# Patient Record
Sex: Male | Born: 1975 | Race: White | Hispanic: No | Marital: Married | Smoking: Former smoker
Health system: Southern US, Community
[De-identification: ages and names within clinical notes are randomized; demographics above are authoritative.]

## PROBLEM LIST (undated history)

## (undated) DIAGNOSIS — J45909 Unspecified asthma, uncomplicated: Secondary | ICD-10-CM

## (undated) DIAGNOSIS — K859 Acute pancreatitis without necrosis or infection, unspecified: Secondary | ICD-10-CM

## (undated) DIAGNOSIS — F3132 Bipolar disorder, current episode depressed, moderate: Secondary | ICD-10-CM

## (undated) HISTORY — DX: Acute pancreatitis without necrosis or infection, unspecified: K85.90

## (undated) HISTORY — DX: Unspecified asthma, uncomplicated: J45.909

## (undated) HISTORY — PX: HAND SURGERY: SHX662

## (undated) HISTORY — PX: FINGER SURGERY: SHX640

## (undated) HISTORY — DX: Bipolar disorder, current episode depressed, moderate: F31.32

---

## 2013-12-24 ENCOUNTER — Encounter: Payer: Self-pay | Admitting: Family Medicine

## 2013-12-24 ENCOUNTER — Ambulatory Visit (INDEPENDENT_AMBULATORY_CARE_PROVIDER_SITE_OTHER): Payer: BC Managed Care – PPO | Admitting: Family Medicine

## 2013-12-24 ENCOUNTER — Encounter: Payer: Self-pay | Admitting: Physician Assistant

## 2013-12-24 VITALS — BP 139/96 | HR 79 | Temp 98.1°F | Ht 66.5 in | Wt 208.0 lb

## 2013-12-24 DIAGNOSIS — R109 Unspecified abdominal pain: Secondary | ICD-10-CM

## 2013-12-24 DIAGNOSIS — R030 Elevated blood-pressure reading, without diagnosis of hypertension: Secondary | ICD-10-CM

## 2013-12-24 DIAGNOSIS — J111 Influenza due to unidentified influenza virus with other respiratory manifestations: Secondary | ICD-10-CM

## 2013-12-24 DIAGNOSIS — K625 Hemorrhage of anus and rectum: Secondary | ICD-10-CM

## 2013-12-24 DIAGNOSIS — F3132 Bipolar disorder, current episode depressed, moderate: Secondary | ICD-10-CM

## 2013-12-24 DIAGNOSIS — F319 Bipolar disorder, unspecified: Secondary | ICD-10-CM

## 2013-12-24 DIAGNOSIS — IMO0001 Reserved for inherently not codable concepts without codable children: Secondary | ICD-10-CM | POA: Insufficient documentation

## 2013-12-24 HISTORY — DX: Bipolar disorder, current episode depressed, moderate: F31.32

## 2013-12-24 LAB — CBC WITH DIFFERENTIAL/PLATELET
BASOS PCT: 0 % (ref 0–1)
Basophils Absolute: 0 10*3/uL (ref 0.0–0.1)
Eosinophils Absolute: 0.1 10*3/uL (ref 0.0–0.7)
Eosinophils Relative: 1 % (ref 0–5)
HCT: 48.2 % (ref 39.0–52.0)
HEMOGLOBIN: 16.6 g/dL (ref 13.0–17.0)
LYMPHS ABS: 3.2 10*3/uL (ref 0.7–4.0)
Lymphocytes Relative: 35 % (ref 12–46)
MCH: 29 pg (ref 26.0–34.0)
MCHC: 34.4 g/dL (ref 30.0–36.0)
MCV: 84.1 fL (ref 78.0–100.0)
MONOS PCT: 6 % (ref 3–12)
Monocytes Absolute: 0.5 10*3/uL (ref 0.1–1.0)
NEUTROS PCT: 58 % (ref 43–77)
Neutro Abs: 5.3 10*3/uL (ref 1.7–7.7)
Platelets: 258 10*3/uL (ref 150–400)
RBC: 5.73 MIL/uL (ref 4.22–5.81)
RDW: 13.5 % (ref 11.5–15.5)
WBC: 9.1 10*3/uL (ref 4.0–10.5)

## 2013-12-24 MED ORDER — OSELTAMIVIR PHOSPHATE 75 MG PO CAPS: 75.0000 mg | ORAL_CAPSULE | Freq: Two times a day (BID) | ORAL | Status: DC

## 2013-12-24 MED ORDER — HYOSCYAMINE SULFATE 0.125 MG PO TABS: 0.1250 mg | ORAL_TABLET | ORAL | Status: DC | PRN

## 2013-12-24 MED ORDER — LURASIDONE HCL 20 MG PO TABS: 20.0000 mg | ORAL_TABLET | Freq: Every day | ORAL | Status: DC

## 2013-12-24 NOTE — Patient Instructions (Signed)
Start Latuda samples 20mg , one tablet daily, Rx has also been sent to your pharmacy.

## 2013-12-24 NOTE — Progress Notes (Addendum)
CC: Frederick Lloyd is a 38 y.o. male is here for Establish Care   Subjective: HPI:  Complains of concerns regarding body aches, nasal congestion, fatigue that has been present for the past 24 hours. his son was recently diagnosed with influenza. Patient reports frequent direct contact with his son. Symptoms have been worsening slowly since acute onset. No interventions as of yet nothing makes better or worse. Symptoms overall are moderate to severe in severity.  Patient complains of rectal bleeding off and on for the past 2-3 years. He describes it as bright red blood and also tarry appearance to his stools. It is accompanied by abdominal pain which is slightly relieved with taking Levsin. Workup has included a noncontrast CT at [facility] which was unremarkable. He has been unable to see a GI physician due to lack of insurance which he recently acquired. Abdominal pain is described only as pain and can occur anywhere within the abdomen. Pain is not improved or relieved by defecation or diet. Denies unintentional weight loss.  Patient reports a history of bipolar disorder with predominance of depression and very minimal manic activity. This was diagnosed in his late teenage years and has only been well managed when he was taking JordanLatuda which she has been unable to afford for years. He's also tried Depakote but this caused intolerable sedation. He reports he is currently at least moderately depressed and often feels that everything in life is futile, denies thoughts when himself or others. Symptoms are worsened by physical health issues described above. Nothing else particularly makes better or worse.  Review of Systems - General ROS: negative for - chills, fever, night sweats, weight gain or weight loss Ophthalmic ROS: negative for - decreased vision Psychological ROS: negative for - anxiety ENT ROS: negative for - hearing change, tinnitus or allergies Hematological and Lymphatic ROS: negative  for -  bruising or swollen lymph nodes Breast ROS: negative Respiratory ROS: no , shortness of breath, or wheezing Cardiovascular ROS: no chest pain or dyspnea on exertion Gastrointestinal: No vomiting Genito-Urinary ROS: negative for - genital discharge, genital ulcers, incontinence or abnormal bleeding from genitals Musculoskeletal ROS: negative for - joint pain or muscle pain other than that described above Neurological ROS: negative for - headaches or memory loss Dermatological ROS: negative for lumps, mole changes, rash and skin lesion changes Review Of Systems Outlined In HPI  Past Medical History  Diagnosis Date  . Bipolar 1 disorder, depressed, moderate 12/24/2013    Intolerant of depakote      History reviewed. No pertinent past surgical history. History reviewed. No pertinent family history.  History   Social History  . Marital Status: N/A    Spouse Name: N/A    Number of Children: N/A  . Years of Education: N/A   Occupational History  . Not on file.   Social History Main Topics  . Smoking status: Former Games developermoker  . Smokeless tobacco: Not on file     Comment: former tobacco smoker; uses vapor ciggs  . Alcohol Use: Not on file  . Drug Use: Not on file  . Sexual Activity: Not on file   Other Topics Concern  . Not on file   Social History Narrative  . No narrative on file     Objective: BP 139/96  Pulse 79  Temp(Src) 98.1 F (36.7 C) (Oral)  Ht 5' 6.5" (1.689 m)  Wt 208 lb (94.348 kg)  BMI 33.07 kg/m2  General: Alert and Oriented, No Acute Distress HEENT:  Pupils equal, round, reactive to light. Conjunctivae clear.  External ears unremarkable, canals clear with intact TMs with appropriate landmarks.  Middle ear appears open without effusion. Pink inferior turbinates with moderate mucoid discharge.  Moist mucous membranes, pharynx without inflammation nor lesions.  Neck supple without palpable lymphadenopathy nor abnormal masses. Lungs: Clear to auscultation  bilaterally, no wheezing/ronchi/rales.  Comfortable work of breathing. Good air movement. Cardiac: Regular rate and rhythm. Normal S1/S2.  No murmurs, rubs, nor gallops.   Abdomen: Normal bowel sounds, soft without rebound or guarding. There is mild left lower quadrant tenderness without any palpable masses nor pain in any other quadrant. Extremities: No peripheral edema.  Strong peripheral pulses.  Mental Status: Moderate depression, tearful. No anxiety, nor agitation. Skin: Warm and dry.  Assessment & Plan: Elijha was seen today for establish care.  Diagnoses and associated orders for this visit:  Bipolar disorder, unspecified - Lurasidone HCl (LATUDA) 20 MG TABS; Take 1 tablet (20 mg total) by mouth daily.  Elevated blood pressure  Abdominal pain, unspecified site - Ambulatory referral to Gastroenterology - hyoscyamine (LEVSIN) 0.125 MG tablet; Take 1 tablet (0.125 mg total) by mouth every 4 (four) hours as needed. - COMPLETE METABOLIC PANEL WITH GFR  Rectal bleeding - Ambulatory referral to Gastroenterology - CBC w/Diff  Influenza - oseltamivir (TAMIFLU) 75 MG capsule; Take 1 capsule (75 mg total) by mouth 2 (two) times daily.  Bipolar 1 disorder, depressed, moderate    Bipolar disorder: Uncontrolled restart Lurasidone, 3 weeks samples were provided along with a prescription Influenza: Given the beginning of his symptoms and a known influenza contact at home start Tamiflu Rectal bleeding: Will arrange gastroenterology referral for consideration of colonoscopy, checking lipids and hemoglobin today Abdominal pain: Referral to GI with white count and differential today along with complete metabolic panel  Return in about 4 weeks (around 01/21/2014) for Bipolar Followup.

## 2013-12-25 LAB — COMPLETE METABOLIC PANEL WITH GFR
ALBUMIN: 4.4 g/dL (ref 3.5–5.2)
ALK PHOS: 55 U/L (ref 39–117)
ALT: 27 U/L (ref 0–53)
AST: 23 U/L (ref 0–37)
BILIRUBIN TOTAL: 0.4 mg/dL (ref 0.2–1.2)
BUN: 9 mg/dL (ref 6–23)
CO2: 26 meq/L (ref 19–32)
Calcium: 9.3 mg/dL (ref 8.4–10.5)
Chloride: 104 mEq/L (ref 96–112)
Creat: 0.94 mg/dL (ref 0.50–1.35)
GFR, Est African American: >89 mL/min
GFR, Est Non African American: >89 mL/min
Glucose, Bld: 102 mg/dL — ABNORMAL HIGH (ref 70–99)
Potassium: 4.4 mEq/L (ref 3.5–5.3)
SODIUM: 139 meq/L (ref 135–145)
TOTAL PROTEIN: 7.1 g/dL (ref 6.0–8.3)

## 2013-12-31 ENCOUNTER — Telehealth: Payer: Self-pay | Admitting: Family Medicine

## 2013-12-31 DIAGNOSIS — F3132 Bipolar disorder, current episode depressed, moderate: Secondary | ICD-10-CM

## 2013-12-31 MED ORDER — ARIPIPRAZOLE 10 MG PO TABS: 10.0000 mg | ORAL_TABLET | Freq: Every day | ORAL | Status: DC

## 2013-12-31 NOTE — Telephone Encounter (Signed)
Sue Lushndrea, Will you please let patient know that [employer] has denied coverage for Latuda.  He can either pay for this out of pocket or try another option on his formulary such as Seroquel Immediate Release, Abilify, or Zyprexa.  I'd recommend Abilify given my success with other patient's in the past.  Please let me know if he has a preference.

## 2013-12-31 NOTE — Telephone Encounter (Signed)
[facility] [place]. Stop taking latuda once abilify is available.

## 2013-12-31 NOTE — Telephone Encounter (Signed)
Pt notified and he is willing to try abilify

## 2014-01-01 ENCOUNTER — Ambulatory Visit (INDEPENDENT_AMBULATORY_CARE_PROVIDER_SITE_OTHER): Payer: BC Managed Care – PPO | Admitting: Physician Assistant

## 2014-01-01 ENCOUNTER — Encounter: Payer: Self-pay | Admitting: Physician Assistant

## 2014-01-01 VITALS — BP 106/78 | HR 68 | Ht 66.5 in | Wt 210.4 lb

## 2014-01-01 DIAGNOSIS — G8929 Other chronic pain: Secondary | ICD-10-CM

## 2014-01-01 DIAGNOSIS — R197 Diarrhea, unspecified: Secondary | ICD-10-CM

## 2014-01-01 DIAGNOSIS — K625 Hemorrhage of anus and rectum: Secondary | ICD-10-CM

## 2014-01-01 DIAGNOSIS — R1031 Right lower quadrant pain: Secondary | ICD-10-CM

## 2014-01-01 DIAGNOSIS — R1032 Left lower quadrant pain: Secondary | ICD-10-CM

## 2014-01-01 DIAGNOSIS — R109 Unspecified abdominal pain: Secondary | ICD-10-CM

## 2014-01-01 MED ORDER — TRAMADOL HCL 50 MG PO TABS: 50.0000 mg | ORAL_TABLET | Freq: Four times a day (QID) | ORAL | Status: DC | PRN

## 2014-01-01 MED ORDER — HYOSCYAMINE SULFATE 0.125 MG PO TABS: 0.1250 mg | ORAL_TABLET | ORAL | Status: DC | PRN

## 2014-01-01 NOTE — Progress Notes (Signed)
Reviewed and agree with management plan. Please attempt to obtain Abd CT and [facility]. Short term Ultram with no plans for additional pain medications.   Venita LickMalcolm T. Russella DarStark, MD [facility]

## 2014-01-01 NOTE — Telephone Encounter (Signed)
Pt aware.

## 2014-01-01 NOTE — Progress Notes (Signed)
Subjective:    Patient ID: Frederick Lloyd, male    DOB: September 17, 1976, 38 y.o.   MRN: [id number]  HPI  Frederick Lloyd is a 38 year old white male new to GI today self-referred. He is generally in good health does have history of bipolar disorder and is followed by Dr. Ivan Anchors. Patient says that he has been having problems over the past 2 years and progressive over the past one year. He says he said prior ER visits for abdominal pain diarrhea and rectal bleeding both he and he agreed and at [facility]. He says he had a CT scan done at [facility] it did show "inflammation" but he did not have insurance at that time and was unable to see a gastroenterologist. He says he has recently obtained insurance and comes in for valuation. He says over the past year he has been having diarrhea on a daily basis usually 5-8 bowel movements per day of loose liquid stool. Occasionally he has some form to his stool. He has a lot of gas and flatulence,as well as frequent bright red blood mixed with his bowel movements. He says occasionally he passes something that looks "like a red Jell-O substance". He has ongoing issues with abdominal cramping bloating and sometimes may go a day without a bowel movement and then have a bad day with cramping and several bowel movements. He says sometimes the pain is so bad he develops nausea but usually doesn't vomit. His appetite has been fair his weight has been stable was not had any documented fevers. He has been given a prescription for Levsin which she says does help cramping. Family history is negative for IBD is far as he is aware he does have a great grandmother who had colon cancer.    Review of Systems  Constitutional: Positive for fatigue.  HENT: Negative.   Eyes: Negative.   Respiratory: Negative.   Cardiovascular: Negative.   Gastrointestinal: Positive for abdominal pain, diarrhea and blood in stool.  Endocrine: Negative.   Genitourinary: Negative.   Musculoskeletal:  Negative.   Allergic/Immunologic: Negative.   Neurological: Negative.   Hematological: Negative.   Psychiatric/Behavioral: Negative.    Outpatient Prescriptions Prior to Visit  Medication Sig Dispense Refill  . ARIPiprazole (ABILIFY) 10 MG tablet Take 1 tablet (10 mg total) by mouth daily.  30 tablet  1  . hyoscyamine (LEVSIN) 0.125 MG tablet Take 1 tablet (0.125 mg total) by mouth every 4 (four) hours as needed.  60 tablet  1  . oseltamivir (TAMIFLU) 75 MG capsule Take 1 capsule (75 mg total) by mouth 2 (two) times daily.  10 capsule  0   No facility-administered medications prior to visit.      No Known Allergies Patient Active Problem List   Diagnosis Date Noted  . Bipolar 1 disorder, depressed, moderate 12/24/2013  . Elevated blood pressure 12/24/2013  . Abdominal pain, unspecified site 12/24/2013  . Rectal bleeding 12/24/2013   History  Substance Use Topics  . Smoking status: Former Smoker    Types: Cigarettes    Quit date: 09/26/2012  . Smokeless tobacco: Never Used     Comment: former tobacco smoker; uses vapor ciggs  . Alcohol Use: Yes     Comment: occasional   family history includes Colon cancer in his maternal uncle and paternal grandmother; Diabetes in his other; Heart disease in his paternal grandfather; Pancreatic cancer in his father.  Objective:   Physical Exam   Well-developed white male in no acute distress, blood pressure  106/78 pulse 68 height 5 foot 6 weight 210. HEENT; nontraumatic normocephalic EOMI PERRLA sclera anicteric, Supple; no JVD, Cardiovascular ;regular rate and rhythm with S1-S2 no murmur or gallop, Pulmonary; clear bilaterally, Abdomen; soft and is tender in both lower quadrants right greater than left no guarding or rebound no palpable mass or hepatosplenomegaly, Rectal; exam not done, Extremities ;no clubbing cyanosis or edema skin warm and dry multiple tattoos, Psych; mood and affect appropriate       Assessment & Plan:  #1  38 yo old  male with 1-1/2-2 year history of lower abdominal pain, diarrhea and frequent rectal  bleeding. Will need to rule out IBD versus IBS with local anorectal source for bleeding #2 bipolar disorder  Plan; patient had recent labs done with CBC and cemented both unremarkable; Will schedule for colonoscopy with Dr. Jalene MulletStark-procedure discussed in detail and he is agreeable to proceed Continue Levsin sublingual every 4-6 hours as needed for cramping Patient asked for pain medication and is given a short term prescription for Ultram 50 mg every 6-8 hours as needed for pain #30 no refills- I also advised him that we would not continue to prescribe pain meds  Further plans pending results of colonoscopy.

## 2014-01-01 NOTE — Patient Instructions (Signed)
We sent refills for Levsin antispasmodic [facility] [place].  We also faxed them a prescription for Ultram ( Tramadol ) for pain.  You have been scheduled for a colonoscopy with propofol. Please follow written instructions given to you at your visit today.  We have given you a sample prep. You will not need to get one from the pharmacy. If you use inhalers (even only as needed), please bring them with you on the day of your procedure. Your physician has requested that you go to www.startemmi.com and enter the access code given to you at your visit today. This web site gives a general overview about your procedure. However, you should still follow specific instructions given to you by our office regarding your preparation for the procedure.

## 2014-01-02 ENCOUNTER — Ambulatory Visit (AMBULATORY_SURGERY_CENTER): Payer: BC Managed Care – PPO | Admitting: Gastroenterology

## 2014-01-02 ENCOUNTER — Encounter: Payer: Self-pay | Admitting: Gastroenterology

## 2014-01-02 VITALS — BP 116/69 | HR 65 | Temp 96.6°F | Resp 15 | Ht 66.5 in | Wt 210.0 lb

## 2014-01-02 DIAGNOSIS — K921 Melena: Secondary | ICD-10-CM

## 2014-01-02 DIAGNOSIS — R197 Diarrhea, unspecified: Secondary | ICD-10-CM

## 2014-01-02 DIAGNOSIS — K625 Hemorrhage of anus and rectum: Secondary | ICD-10-CM

## 2014-01-02 DIAGNOSIS — R109 Unspecified abdominal pain: Secondary | ICD-10-CM

## 2014-01-02 MED ORDER — GLYCOPYRROLATE 2 MG PO TABS: 2.0000 mg | ORAL_TABLET | Freq: Two times a day (BID) | ORAL | Status: DC

## 2014-01-02 MED ORDER — SODIUM CHLORIDE 0.9 % IV SOLN: 500.0000 mL | INTRAVENOUS | Status: DC

## 2014-01-02 MED ORDER — HYOSCYAMINE SULFATE 0.125 MG PO TABS: 0.1250 mg | ORAL_TABLET | ORAL | Status: DC | PRN

## 2014-01-02 NOTE — Op Note (Signed)
[facility] [address] [place], [id number]   COLONOSCOPY PROCEDURE REPORT  PATIENT: Frederick Lloyd, Frederick Lloyd  MR#: [id number] BIRTHDATE: [birth year] , 37  yrs. old GENDER: Male ENDOSCOPIST: Meryl DareMalcolm T Laron Angelini, MD, [facility] REFERRED GN:FAOZBY:Sean Hommel, D.O. PROCEDURE DATE:  01/02/2014 PROCEDURE:   Colonoscopy with biopsy First Screening Colonoscopy - Avg.  risk and is 50 yrs.  old or older - No.  Prior Negative Screening - Now for repeat screening. N/A  History of Adenoma - Now for follow-up colonoscopy & has been > or = to 3 yrs.  N/A  Polyps Removed Today? No.  Recommend repeat exam, <10 yrs? No. ASA CLASS:   Class Lloyd INDICATIONS:Unexplained diarrhea, abdominal pain, and hematochezia.  MEDICATIONS: MAC sedation, administered by CRNA and propofol (Diprivan) 400mg  IV DESCRIPTION OF PROCEDURE:   After the risks benefits and alternatives of the procedure were thoroughly explained, informed consent was obtained.  A digital rectal exam revealed no abnormalities of the rectum.   The LB [id number]  endoscope was introduced through the anus and advanced to the terminal ileum which was intubated for a short distance. No adverse events experienced.   The quality of the prep was good, using MoviPrep The instrument was then slowly withdrawn as the colon was fully examined.  COLON FINDINGS: A normal appearing cecum, ileocecal valve, and appendiceal orifice were identified.  The ascending, hepatic flexure, transverse, splenic flexure, descending, sigmoid colon and rectum appeared unremarkable.  No polyps or cancers were seen. Multiple random biopsies of the left colon were performed.   The mucosa appeared normal in the terminal ileum.  Retroflexed views revealed small internal hemorrhoids. The time to cecum=2 minutes 30 seconds.  Withdrawal time=10 minutes 08 seconds.  The scope was withdrawn and the procedure completed. COMPLICATIONS: There were no complications.  ENDOSCOPIC  IMPRESSION: 1.   Normal colon; multiple random biopsies performed 2.   Normal mucosa in the terminal ileum 3.   Internal hemorrhoids  RECOMMENDATIONS: 1.  Await pathology results 2.  Prep H supp daily as needed and standard rectal care instructions for hemorrhoids 3.  Glycopyrrolate 2 mg po bid, 1 year of refills, for IBS 4.  Continue Levsin 1-2 PO/SL qh4 prn abd pain, for IBS 5.  FODMAP diet for IBS 6.  Office appt in 6 weeks  eSigned:  Meryl DareMalcolm T Alaysha Jefcoat, MD, [facility] 01/02/2014 10:56 AM     PATIENT NAME:  Frederick Lloyd, Frederick Lloyd MR#: [id number]

## 2014-01-02 NOTE — Patient Instructions (Signed)
YOU HAD AN ENDOSCOPIC PROCEDURE TODAY AT THE [facility]: Refer to the procedure report that was given to you for any specific questions about what was found during the examination.  If the procedure report does not answer your questions, please call your gastroenterologist to clarify.  If you requested that your care partner not be given the details of your procedure findings, then the procedure report has been included in a sealed envelope for you to review at your convenience later.  YOU SHOULD EXPECT: Some feelings of bloating in the abdomen. Passage of more gas than usual.  Walking can help get rid of the air that was put into your GI tract during the procedure and reduce the bloating. If you had a lower endoscopy (such as a colonoscopy or flexible sigmoidoscopy) you may notice spotting of blood in your stool or on the toilet paper. If you underwent a bowel prep for your procedure, then you may not have a normal bowel movement for a few days.  DIET: Your first meal following the procedure should be a light meal and then it is ok to progress to your normal diet.  A half-sandwich or bowl of soup is an example of a good first meal.  Heavy or fried foods are harder to digest and may make you feel nauseous or bloated.  Likewise meals heavy in dairy and vegetables can cause extra gas to form and this can also increase the bloating.  Drink plenty of fluids but you should avoid alcoholic beverages for 24 hours.  ACTIVITY: Your care partner should take you home directly after the procedure.  You should plan to take it easy, moving slowly for the rest of the day.  You can resume normal activity the day after the procedure however you should NOT DRIVE or use heavy machinery for 24 hours (because of the sedation medicines used during the test).    SYMPTOMS TO REPORT IMMEDIATELY: A gastroenterologist can be reached at any hour.  During normal business hours, 8:30 AM to 5:00 PM Monday through Friday,  call [phone].  After hours and on weekends, please call the GI answering service at [phone] who will take a message and have the physician on call contact you.   Following lower endoscopy (colonoscopy or flexible sigmoidoscopy):  Excessive amounts of blood in the stool  Significant tenderness or worsening of abdominal pains  Swelling of the abdomen that is new, acute  Fever of 100F or higher    FOLLOW UP: If any biopsies were taken you will be contacted by phone or by letter within the next 1-3 weeks.  Call your gastroenterologist if you have not heard about the biopsies in 3 weeks.  Our staff will call the home number listed on your records the next business day following your procedure to check on you and address any questions or concerns that you may have at that time regarding the information given to you following your procedure. This is a courtesy call and so if there is no answer at the home number and we have not heard from you through the emergency physician on call, we will assume that you have returned to your regular daily activities without incident.  SIGNATURES/CONFIDENTIALITY: You and/or your care partner have signed paperwork which will be entered into your electronic medical record.  These signatures attest to the fact that that the information above on your After Visit Summary has been reviewed and is understood.  Full responsibility of the confidentiality   of this discharge information lies with you and/or your care-partner.     

## 2014-01-02 NOTE — Progress Notes (Signed)
Called to room to assist during endoscopic procedure.  Patient ID and intended procedure confirmed with present staff. Received instructions for my participation in the procedure from the performing physician.  

## 2014-01-02 NOTE — Progress Notes (Signed)
Lidocaine-40mg IV prior to Propofol InductionPropofol given over incremental dosages 

## 2014-01-03 ENCOUNTER — Telehealth: Payer: Self-pay

## 2014-01-03 NOTE — Telephone Encounter (Signed)
  Follow up Call-  Call back number 01/02/2014  Post procedure Call Back phone  # [phone]  Permission to leave phone message Yes     Patient questions:  Do you have a fever, pain , or abdominal swelling? no Pain Score  0 *  Have you tolerated food without any problems? yes  Have you been able to return to your normal activities? yes  Do you have any questions about your discharge instructions: Diet   no Medications  no Follow up visit  no  Do you have questions or concerns about your Care? no  Actions: * If pain score is 4 or above: No action needed, pain <4.

## 2014-01-07 ENCOUNTER — Encounter: Payer: Self-pay | Admitting: Gastroenterology

## 2014-01-21 ENCOUNTER — Ambulatory Visit (INDEPENDENT_AMBULATORY_CARE_PROVIDER_SITE_OTHER): Payer: BC Managed Care – PPO | Admitting: Family Medicine

## 2014-01-21 ENCOUNTER — Encounter: Payer: Self-pay | Admitting: Family Medicine

## 2014-01-21 VITALS — BP 141/93 | HR 89 | Wt 209.0 lb

## 2014-01-21 DIAGNOSIS — M79642 Pain in left hand: Secondary | ICD-10-CM

## 2014-01-21 DIAGNOSIS — F3132 Bipolar disorder, current episode depressed, moderate: Secondary | ICD-10-CM

## 2014-01-21 DIAGNOSIS — G5600 Carpal tunnel syndrome, unspecified upper limb: Secondary | ICD-10-CM

## 2014-01-21 DIAGNOSIS — G5601 Carpal tunnel syndrome, right upper limb: Secondary | ICD-10-CM

## 2014-01-21 DIAGNOSIS — M79609 Pain in unspecified limb: Secondary | ICD-10-CM

## 2014-01-21 MED ORDER — GABAPENTIN 300 MG PO CAPS: 300.0000 mg | ORAL_CAPSULE | Freq: Three times a day (TID) | ORAL | Status: DC

## 2014-01-21 MED ORDER — CELECOXIB 200 MG PO CAPS: 200.0000 mg | ORAL_CAPSULE | Freq: Two times a day (BID) | ORAL | Status: DC | PRN

## 2014-01-21 NOTE — Progress Notes (Signed)
CC: Frederick Lloyd is a 38 y.o. male is here for f/u bipolar   Subjective: HPI:  Follow bipolar disorder: Continues to take Latuda 40mg  on a daily basis without missed doses. He states that he feels much more stable from an emotional standpoint. Still has occasional anxiety but denies any manic-like activity or depression. He has not switched over to Abilify yet due to a $75 co-pay, he is still using samples and left over medication from about a year ago. Denies any mental disturbance other than anxiety which is only worsened at work.  Complains of left hand pain that has been present for the past one week. Is accompanied with swelling and redness at the second and third MCP joint dorsal surface. Described only as aching and moderate in severity. Worse with repetitive movements. Pain is nonradiating. Slightly improved with ibuprofen no other interventions as of yet.  He describes a long history of trauma and orthopedic surgery done to the right hand however no chronic issues with the left hand.  Denies motor or sensory disturbances in the left hand. No improvement with tramadol.  Complains of right hand numbness that has been present for matter of decades. Has been present ever since dislocation of the wrist. He tells me he's had nerve conduction studies done years ago which were normal in the right upper extremity. He reports decreased fine motor skills, and mild numbness in the thumb index and middle finger located on the palm that has been present for matter of years and has not been getting better or worsens onset, currently moderate in severity. Worse with repetitive movements. He has had a benefit from gabapentin for this in the past  Review of systems positive for alternating constipation and diarrhea since his colonoscopy.  Review Of Systems Outlined In HPI  Past Medical History  Diagnosis Date  . Bipolar 1 disorder, depressed, moderate 12/24/2013    Intolerant of depakote    .  Pancreatitis   . Asthmatic bronchitis     Past Surgical History  Procedure Laterality Date  . Hand surgery Right     finger surgery andskin graft  . Finger surgery Right    Family History  Problem Relation Age of Onset  . Colon cancer Maternal Uncle   . Colon cancer Paternal Grandmother     great  . Pancreatic cancer Father     ?  . Diabetes Other     both sides of the family  . Heart disease Paternal Grandfather     History   Social History  . Marital Status: Married    Spouse Name: N/A    Number of Children: 4  . Years of Education: N/A   Occupational History  . Air traffic controllerautomotive tech    Social History Main Topics  . Smoking status: Former Smoker    Types: Cigarettes    Quit date: 09/26/2012  . Smokeless tobacco: Never Used     Comment: former tobacco smoker; uses vapor ciggs  . Alcohol Use: Yes     Comment: occasional  . Drug Use: No  . Sexual Activity: Not on file   Other Topics Concern  . Not on file   Social History Narrative  . No narrative on file     Objective: BP 141/93  Pulse 89  Wt 209 lb (94.802 kg)  General: Alert and Oriented, No Acute Distress HEENT: Pupils equal, round, reactive to light. Conjunctivae clear. Moist mucous membranes pharynx unremarkable Lungs: Clear to auscultation bilaterally, no wheezing/ronchi/rales.  Comfortable work of breathing. Good air movement. Cardiac: Regular rate and rhythm. Normal S1/S2.  No murmurs, rubs, nor gallops.  Mild redness and swelling over the dorsal surface of second and third MCP joints in the left hand, pain is reproduced with palpation of these joints are resisted finger flexion. Reverse Phalen's is negative on the left however positive on the right. Full range of motion and strength in both hands. Extremities: No peripheral edema.  Strong peripheral pulses.  Mental Status: No depression, anxiety, nor agitation. Skin: Warm and dry.  Assessment & Plan: Frederick Lloyd was seen today for f/u bipolar.  Diagnoses  and associated orders for this visit:  Bipolar 1 disorder, depressed, moderate  Elevated blood pressure  Left hand pain - celecoxib (CELEBREX) 200 MG capsule; Take 1 capsule (200 mg total) by mouth 2 (two) times daily as needed (hand pain).  Right carpal tunnel syndrome - gabapentin (NEURONTIN) 300 MG capsule; Take 1 capsule (300 mg total) by mouth 3 (three) times daily.    Bipolar disorder: Improved and stable, I've given him 3 more weeks worth of Latuda until he can afford switching to Abilify. Followup with me approximately 2 weeks after the switch Left hand pain: Suspect osteoarthritis start Celebrex, encouraged him to stop taking Ultram for abdominal pain as this could be contributing to constipation Right carpal tunnel syndrome: Restart former regimen of gabapentin along with stretching Elevated blood pressure: Discussed sodium restriction, trial of lifestyle changes before starting medication  40 minutes spent face-to-face during visit today of which at least 50% was counseling or coordinating care regarding: 1. Bipolar 1 disorder, depressed, moderate   2. Left hand pain   3. Right carpal tunnel syndrome        Return in about 4 weeks (around 02/18/2014).

## 2014-02-05 ENCOUNTER — Ambulatory Visit (INDEPENDENT_AMBULATORY_CARE_PROVIDER_SITE_OTHER): Payer: BC Managed Care – PPO | Admitting: Gastroenterology

## 2014-02-05 ENCOUNTER — Encounter: Payer: Self-pay | Admitting: Gastroenterology

## 2014-02-05 VITALS — BP 120/88 | HR 64 | Ht 66.5 in | Wt 211.8 lb

## 2014-02-05 DIAGNOSIS — K5900 Constipation, unspecified: Secondary | ICD-10-CM

## 2014-02-05 DIAGNOSIS — K589 Irritable bowel syndrome without diarrhea: Secondary | ICD-10-CM

## 2014-02-05 NOTE — Progress Notes (Signed)
    History of Present Illness: This is a 38 year old male with irritable bowel syndrome and internal hemorrhoids. His recent colonoscopy was unremarkable except for internal hemorrhoids. His symptoms are substantially improved on glycopyrrolate and hyoscyamine. He now has mild constipation that improves if he misses a dose of glycopyrrolate. He occasionally notes rectal bleeding but this is also improved.  Current Medications, Allergies, Past Medical History, Past Surgical History, Family History and Social History were reviewed in [facility] electronic medical record.  Physical Exam: General: Well developed , well nourished, no acute distress Head: Normocephalic and atraumatic Eyes:  sclerae anicteric, EOMI Ears: Normal auditory acuity Mouth: No deformity or lesions Lungs: Clear throughout to auscultation Heart: Regular rate and rhythm; no murmurs, rubs or bruits Abdomen: Soft, non tender and non distended. No masses, hepatosplenomegaly or hernias noted. Normal Bowel sounds Musculoskeletal: Symmetrical with no gross deformities  Pulses:  Normal pulses noted Extremities: No clubbing, cyanosis, edema or deformities noted Neurological: Alert oriented x 4, grossly nonfocal Psychological:  Alert and cooperative. Normal mood and affect  Assessment and Recommendations:  1. IBS with constipation. Continue glycopyrrolate and hyoscyamine. Increase dietary water and fiber intake. Begin Colace once or twice daily and his constipation is not adequately controlled with fiber and water. Return office visit in one year.  2. Internal hemorrhoids leading to hematochezia. High fiber diet increased water intake as above. Preparation H suppositories daily as needed.

## 2014-02-05 NOTE — Patient Instructions (Signed)
Take over the counter Benefiber once daily for constipation. If this does not adequately control your symptoms then you can add Colace 2 tablets a day.   High-Fiber Diet Fiber is found in fruits, vegetables, and grains. A high-fiber diet encourages the addition of more whole grains, legumes, fruits, and vegetables in your diet. The recommended amount of fiber for adult males is 38 g per day. For adult females, it is 25 g per day. Pregnant and lactating women should get 28 g of fiber per day. If you have a digestive or bowel problem, ask your caregiver for advice before adding high-fiber foods to your diet. Eat a variety of high-fiber foods instead of only a select few type of foods.  PURPOSE  To increase stool bulk.  To make bowel movements more regular to prevent constipation.  To lower cholesterol.  To prevent overeating. WHEN IS THIS DIET USED?  It may be used if you have constipation and hemorrhoids.  It may be used if you have uncomplicated diverticulosis (intestine condition) and irritable bowel syndrome.  It may be used if you need help with weight management.  It may be used if you want to add it to your diet as a protective measure against atherosclerosis, diabetes, and cancer. SOURCES OF FIBER  Whole-grain breads and cereals.  Fruits, such as apples, oranges, bananas, berries, prunes, and pears.  Vegetables, such as green peas, carrots, sweet potatoes, beets, broccoli, cabbage, spinach, and artichokes.  Legumes, such split peas, soy, lentils.  Almonds. FIBER CONTENT IN FOODS Starches and Grains / Dietary Fiber (g)  Cheerios, 1 cup / 3 g  Corn Flakes cereal, 1 cup / 0.7 g  Rice crispy treat cereal, 1 cup / 0.3 g  Instant oatmeal (cooked),  cup / 2 g  Frosted wheat cereal, 1 cup / 5.1 g  Brown, long-grain rice (cooked), 1 cup / 3.5 g  White, long-grain rice (cooked), 1 cup / 0.6 g  Enriched macaroni (cooked), 1 cup / 2.5 g Legumes / Dietary Fiber  (g)  Baked beans (canned, plain, or vegetarian),  cup / 5.2 g  Kidney beans (canned),  cup / 6.8 g  Pinto beans (cooked),  cup / 5.5 g Breads and Crackers / Dietary Fiber (g)  Plain or honey graham crackers, 2 squares / 0.7 g  Saltine crackers, 3 squares / 0.3 g  Plain, salted pretzels, 10 pieces / 1.8 g  Whole-wheat bread, 1 slice / 1.9 g  White bread, 1 slice / 0.7 g  Raisin bread, 1 slice / 1.2 g  Plain bagel, 3 oz / 2 g  Flour tortilla, 1 oz / 0.9 g  Corn tortilla, 1 small / 1.5 g  Hamburger or hotdog bun, 1 small / 0.9 g Fruits / Dietary Fiber (g)  Apple with skin, 1 medium / 4.4 g  Sweetened applesauce,  cup / 1.5 g  Banana,  medium / 1.5 g  Grapes, 10 grapes / 0.4 g  Orange, 1 small / 2.3 g  Raisin, 1.5 oz / 1.6 g  Melon, 1 cup / 1.4 g Vegetables / Dietary Fiber (g)  Green beans (canned),  cup / 1.3 g  Carrots (cooked),  cup / 2.3 g  Broccoli (cooked),  cup / 2.8 g  Peas (cooked),  cup / 4.4 g  Mashed potatoes,  cup / 1.6 g  Lettuce, 1 cup / 0.5 g  Corn (canned),  cup / 1.6 g  Tomato,  cup / 1.1 g Document Released: 09/12/2005  Document Revised: 03/13/2012 Document Reviewed: 12/15/2011 [facility] Patient Information 2014 [place] [place].   Thank you for choosing me and [facility].  Venita LickMalcolm T. Pleas KochStark, Jr., MD., Clementeen GrahamFACG

## 2014-03-04 ENCOUNTER — Ambulatory Visit: Payer: BC Managed Care – PPO | Admitting: Family Medicine

## 2014-03-17 ENCOUNTER — Ambulatory Visit: Payer: BC Managed Care – PPO | Admitting: Family Medicine

## 2014-03-17 ENCOUNTER — Telehealth: Payer: Self-pay | Admitting: Family Medicine

## 2014-03-17 DIAGNOSIS — Z0289 Encounter for other administrative examinations: Secondary | ICD-10-CM

## 2014-03-17 NOTE — Telephone Encounter (Signed)
Frederick Hashimotoatricia, Can you please advise patient to f/u in the next 1-2 weeks for follow up of "bipolar"?      Patient has been deemed a "no-show" for today's scheduled appointment.  Based on chief complaint and my chart review:  -Follow-up advised.  Contacting patient and urged to schedule visit in 1-2 weeks.   this was sent to my support staff to be communicated to the patient via phone or mail on: 5:08 PM @TODAY @

## 2014-03-18 NOTE — Telephone Encounter (Signed)
There's a list of other medications we can go over when he follows up.  We'll try to work around any barriers put up by his insurance plan.

## 2014-03-18 NOTE — Telephone Encounter (Signed)
Left vm msg about dr's response and for pt to call back to schedule appt.

## 2014-03-18 NOTE — Telephone Encounter (Signed)
Dr Ivan AnchorsHommel, I called the patient to reschedule appt. He said that before he reschedules he wants to talk to you about insurance not covering abilify and right now he's not taking anything.

## 2014-03-27 NOTE — Telephone Encounter (Signed)
Pt scheduled an appt for 7/6.

## 2014-03-31 ENCOUNTER — Ambulatory Visit: Payer: BC Managed Care – PPO | Admitting: Family Medicine

## 2014-04-02 ENCOUNTER — Encounter: Payer: Self-pay | Admitting: Family Medicine

## 2014-04-02 ENCOUNTER — Ambulatory Visit (INDEPENDENT_AMBULATORY_CARE_PROVIDER_SITE_OTHER): Payer: BC Managed Care – PPO | Admitting: Family Medicine

## 2014-04-02 ENCOUNTER — Other Ambulatory Visit: Payer: Self-pay | Admitting: Gastroenterology

## 2014-04-02 VITALS — BP 152/94 | HR 84 | Wt 215.0 lb

## 2014-04-02 DIAGNOSIS — R109 Unspecified abdominal pain: Secondary | ICD-10-CM

## 2014-04-02 DIAGNOSIS — F3132 Bipolar disorder, current episode depressed, moderate: Secondary | ICD-10-CM

## 2014-04-02 DIAGNOSIS — G5601 Carpal tunnel syndrome, right upper limb: Secondary | ICD-10-CM

## 2014-04-02 DIAGNOSIS — G5600 Carpal tunnel syndrome, unspecified upper limb: Secondary | ICD-10-CM

## 2014-04-02 DIAGNOSIS — F411 Generalized anxiety disorder: Secondary | ICD-10-CM

## 2014-04-02 MED ORDER — CLONAZEPAM 0.5 MG PO TABS: 0.5000 mg | ORAL_TABLET | Freq: Every day | ORAL | Status: DC | PRN

## 2014-04-02 MED ORDER — HYOSCYAMINE SULFATE 0.125 MG PO TABS: 0.1250 mg | ORAL_TABLET | ORAL | Status: DC | PRN

## 2014-04-02 MED ORDER — ESCITALOPRAM OXALATE 10 MG PO TABS: 10.0000 mg | ORAL_TABLET | Freq: Every day | ORAL | Status: DC

## 2014-04-02 MED ORDER — GABAPENTIN 300 MG PO CAPS: 300.0000 mg | ORAL_CAPSULE | Freq: Three times a day (TID) | ORAL | Status: DC

## 2014-04-02 NOTE — Progress Notes (Signed)
CC: Frederick Lloyd is a [age] y.o. male is here for f/u bipolar   Subjective: HPI:  Followup bipolar disorder: He was unable to afford Abilify and never started it. He tells me that he currently is not experiencing much depression, has not had any manic behavior, the only thing that is bothering him from a psychiatric standpoint is irritability and anxiety. Symptoms are moderate to severe in severity and worse at work, slightly improved when he gets at home. He describes it only as irritability and anxiety with racing thoughts in his head however specifically denies paranoia or reoccurring fears. Symptoms are present on a daily basis. Denies sleep difficulty. Denies hallucinations.  Followup abdominal pain: States that Levsin has made drastic improvement with his abdominal pain provided he takes it up to 4 times a day. He ran out of the medication 3 days ago and has had returning abdominal pain that was resolved when he started taking Levsin. There's been no blood in his stool since I saw him last. No nausea, vomiting, nor appetite decrease  Followup right carpal tunnel: Patient states that symptoms have 100% resolved while taking gabapentin, he ran out of medication a couple days ago and has had slow return of the symptoms described as numbness in the right hand with repetitive motions at work.     Review Of Systems Outlined In HPI  Past Medical History  Diagnosis Date  . Bipolar 1 disorder, depressed, moderate 12/24/2013    Intolerant of depakote    . Pancreatitis   . Asthmatic bronchitis     Past Surgical History  Procedure Laterality Date  . Hand surgery Right     finger surgery andskin graft  . Finger surgery Right    Family History  Problem Relation Age of Onset  . Colon cancer Maternal Uncle   . Colon cancer Paternal Grandmother     great  . Pancreatic cancer Father     ?  . Diabetes Other     both sides of the family  . Heart disease Paternal Grandfather     History    Social History  . Marital Status: Married    Spouse Name: N/A    Number of Children: 4  . Years of Education: N/A   Occupational History  . Air traffic controllerautomotive tech    Social History Main Topics  . Smoking status: Former Smoker    Types: Cigarettes    Quit date: 09/26/2012  . Smokeless tobacco: Never Used     Comment: former tobacco smoker; uses vapor ciggs  . Alcohol Use: Yes     Comment: occasional  . Drug Use: No  . Sexual Activity: Not on file   Other Topics Concern  . Not on file   Social History Narrative  . No narrative on file     Objective: BP 152/94  Pulse 84  Wt 215 lb (97.523 kg)  General: Alert and Oriented, No Acute Distress HEENT: Pupils equal, round, reactive to light. Conjunctivae clear.   moist mucous membranes pharynx unremarkable Lungs: Clear to auscultation bilaterally, no wheezing/ronchi/rales.  Comfortable work of breathing. Good air movement. Cardiac: Regular rate and rhythm. Normal S1/S2.  No murmurs, rubs, nor gallops.   Extremities: No peripheral edema.  Strong peripheral pulses.  Mental Status: No depression, anxiety, nor agitation. Skin: Warm and dry.  Assessment & Plan: Frederick KaufmanMarvin was seen today for f/u bipolar.  Diagnoses and associated orders for this visit:  Bipolar 1 disorder, depressed, moderate - escitalopram (LEXAPRO) 10 MG  tablet; Take 1 tablet (10 mg total) by mouth daily.  Abdominal pain, unspecified site - hyoscyamine (LEVSIN) 0.125 MG tablet; Take 1 tablet (0.125 mg total) by mouth every 4 (four) hours as needed. ONE TO TWO PO/SL Q 4 HOURS PRN FOR ABDOMINAL PAIN  Right carpal tunnel syndrome - gabapentin (NEURONTIN) 300 MG capsule; Take 1 capsule (300 mg total) by mouth 3 (three) times daily.  Anxiety state, unspecified - clonazePAM (KLONOPIN) 0.5 MG tablet; Take 1 tablet (0.5 mg total) by mouth daily as needed for anxiety.    Bipolar disorder: Finances are complicating medication options, since he has been experiencing both  anxiety and bipolar disorder is on the depression end of the spectrum start Lexapro with as needed clonazepam, encouraged him to not take clonazepam on a daily basis. Followup in one month if the symptoms have not been improved Abdominal pain: Controlled on Levsin Carpal tunnel syndrome: Controlled on gabapentin  Return in about 3 months (around 07/03/2014).

## 2014-05-08 ENCOUNTER — Telehealth: Payer: Self-pay | Admitting: *Deleted

## 2014-05-08 DIAGNOSIS — F411 Generalized anxiety disorder: Secondary | ICD-10-CM

## 2014-05-08 MED ORDER — CLONAZEPAM 0.5 MG PO TABS: 0.5000 mg | ORAL_TABLET | Freq: Every day | ORAL | Status: DC | PRN

## 2014-05-08 NOTE — Telephone Encounter (Signed)
Pt request a rx for clonazepam

## 2014-06-06 ENCOUNTER — Telehealth: Payer: Self-pay | Admitting: *Deleted

## 2014-06-06 MED ORDER — CITALOPRAM HYDROBROMIDE 20 MG PO TABS: 20.0000 mg | ORAL_TABLET | Freq: Every day | ORAL | Status: DC

## 2014-06-06 NOTE — Telephone Encounter (Signed)
Yes, citalopram works the same way and will be cheaper but might not be as effective.  It's on the 4$ list at [facility] but I've printed it out for him to take it wherever he wants. (in your inbox)

## 2014-06-06 NOTE — Telephone Encounter (Signed)
Pt wants to know if there is anything that he can take in place of Lexapro that may be cheaper. He doesn't have insurance right now and cannot afford the lexapro

## 2014-06-06 NOTE — Telephone Encounter (Signed)
Left message on vm that rx is being faxed to [facility] noted on his chart since I couldn't talk to him directly and its at the end of the day

## 2014-06-18 ENCOUNTER — Other Ambulatory Visit: Payer: Self-pay | Admitting: Family Medicine

## 2014-07-04 ENCOUNTER — Ambulatory Visit: Payer: BC Managed Care – PPO | Admitting: Family Medicine

## 2014-07-04 ENCOUNTER — Telehealth: Payer: Self-pay | Admitting: Family Medicine

## 2014-07-04 NOTE — Telephone Encounter (Signed)
Elease Hashimotoatricia, Can you please relay the following to this patient...    Patient has been deemed a "no-show" for today's scheduled appointment.  Based on chief complaint and my chart review: -Follow-up advised.  Contacting patient and urged to schedule visit in 1-2 weeks.  If necessary this was sent to my support staff to be communicated to the patient via phone or mail on: 5:29 PM today.

## 2014-07-07 NOTE — Telephone Encounter (Signed)
Left vm for Mr. Frederick Lloyd to call to reschedule his missed appt.

## 2014-07-08 NOTE — Telephone Encounter (Signed)
I called Mr. Frederick Lloyd to reschedule appt. He is having insurance issues but still wants to come in. I will call him on Monday to schedule appt per his request.

## 2014-10-01 ENCOUNTER — Encounter: Payer: Self-pay | Admitting: Family Medicine

## 2014-10-01 ENCOUNTER — Telehealth: Payer: Self-pay | Admitting: *Deleted

## 2014-10-01 ENCOUNTER — Ambulatory Visit (INDEPENDENT_AMBULATORY_CARE_PROVIDER_SITE_OTHER): Payer: BLUE CROSS/BLUE SHIELD | Admitting: Family Medicine

## 2014-10-01 VITALS — BP 115/76 | HR 64 | Wt 222.0 lb

## 2014-10-01 DIAGNOSIS — F3132 Bipolar disorder, current episode depressed, moderate: Secondary | ICD-10-CM | POA: Diagnosis not present

## 2014-10-01 DIAGNOSIS — M1712 Unilateral primary osteoarthritis, left knee: Secondary | ICD-10-CM | POA: Diagnosis not present

## 2014-10-01 DIAGNOSIS — G5601 Carpal tunnel syndrome, right upper limb: Secondary | ICD-10-CM | POA: Diagnosis not present

## 2014-10-01 MED ORDER — GABAPENTIN 300 MG PO CAPS: 300.0000 mg | ORAL_CAPSULE | Freq: Three times a day (TID) | ORAL | Status: DC

## 2014-10-01 MED ORDER — CLONAZEPAM 0.5 MG PO TABS: ORAL_TABLET | ORAL | Status: DC

## 2014-10-01 MED ORDER — ALBUTEROL SULFATE HFA 108 (90 BASE) MCG/ACT IN AERS: INHALATION_SPRAY | RESPIRATORY_TRACT | Status: AC

## 2014-10-01 MED ORDER — CELECOXIB 100 MG PO CAPS: 100.0000 mg | ORAL_CAPSULE | Freq: Two times a day (BID) | ORAL | Status: DC | PRN

## 2014-10-01 MED ORDER — LURASIDONE HCL 20 MG PO TABS: 20.0000 mg | ORAL_TABLET | Freq: Every day | ORAL | Status: DC

## 2014-10-01 NOTE — Progress Notes (Signed)
CC: Frederick Lloyd is a 39 y.o. male is here for f/u bipolar disorder   Subjective: HPI:  Follow-up bipolar disorder: He's been unable to afford Latuda due to a inadequate insurance plan that just expired at the end of last year. He's currently taking citalopram on a daily basis which is only mildly helping with depression and has not helped at all with any anxiety or switching from manic activity described as bouts of energy to switching to deep depression. He tells me he cycles an unknown amount of time but many times during the week. No thoughts when on himself or others. Symptoms are severely getting in the way of his quality of life right now. Anxiety is mildly helped with as needed clonazepam. Above symptoms are worse with financial stressors, work stress and family member health stress.  Complains of bilateral knee pain left greater than right. Symptoms are described as aching that is nonradiating and worse after initial activity after periods of inactivity. No pain at rest. Seems to be worse first thing in the morning and improves the more he is moving or staying warm. Denies catching locking giving way or swelling of either knee. Denies any recent or remote trauma.  Requesting refills on gabapentin that he's been taking for occasional low back pain but mostly right carpal tunnel syndrome. Currently symptoms are absent or worsen the more he is physically active at work as a [employer]at[employer]ic. Denies any other motor or sensory disturbances   Review Of Systems Outlined In HPI  Past Medical History  Diagnosis Date  . Bipolar 1 disorder, depressed, moderate 12/24/2013    Intolerant of depakote    . Pancreatitis   . Asthmatic bronchitis     Past Surgical History  Procedure Laterality Date  . Hand surgery Right     finger surgery andskin graft  . Finger surgery Right    Family History  Problem Relation Age of Onset  . Colon cancer Maternal Uncle   . Colon cancer Paternal Grandmother    great  . Pancreatic cancer Father     ?  . Diabetes Other     both sides of the family  . Heart disease Paternal Grandfather     History   Social History  . Marital Status: Married    Spouse Name: N/A    Number of Children: 4  . Years of Education: N/A   Occupational History  . Air traffic controllerautomotive tech    Social History Main Topics  . Smoking status: Former Smoker    Types: Cigarettes    Quit date: 09/26/2012  . Smokeless tobacco: Never Used     Comment: former tobacco smoker; uses vapor ciggs  . Alcohol Use: Yes     Comment: occasional  . Drug Use: No  . Sexual Activity: Not on file   Other Topics Concern  . Not on file   Social History Narrative  . No narrative on file     Objective: BP 115/76 mmHg  Pulse 64  Wt 222 lb (100.699 kg)  General: Alert and Oriented, No Acute Distress HEENT: Pupils equal, round, reactive to light. Conjunctivae clear.  Moist mucous membranes Lungs: Clear to auscultation bilaterally, no wheezing/ronchi/rales.  Comfortable work of breathing. Good air movement. Cardiac: Regular rate and rhythm. Normal S1/S2.  No murmurs, rubs, nor gallops.   Extremities: No peripheral edema.  Strong peripheral pulses.  Mental Status: No depression, anxiety, nor agitation. Skin: Warm and dry.  Assessment & Plan: [name] was seen today for  f/u bipolar disorder.  Diagnoses and associated orders for this visit:  Bipolar 1 disorder, depressed, moderate - clonazePAM (KLONOPIN) 0.5 MG tablet; Half tab by mouth one to two times a day as needed for anxiety. - Lurasidone HCl (LATUDA) 20 MG TABS; Take 1 tablet (20 mg total) by mouth daily.  Primary osteoarthritis of left knee - celecoxib (CELEBREX) 100 MG capsule; Take 1 capsule (100 mg total) by mouth 2 (two) times daily as needed (joint pain).  Right carpal tunnel syndrome - gabapentin (NEURONTIN) 300 MG capsule; Take 1 capsule (300 mg total) by mouth 3 (three) times daily.  Other Orders - albuterol (PROVENTIL  HFA;VENTOLIN HFA) 108 (90 BASE) MCG/ACT inhaler; Inhale two puffs every 4-6 hours only as needed for shortness of breath or wheezing.    Bipolar disorder: Uncontrolled with his new insurance we'll see if they will cover Latuda, if so will titrate up as needed. Continue as needed clonazepam Osteoarthritis of the knees: We will now see if his insurance company will cover Celebrex area Carpal tunnel syndrome: Continue gabapentin as needed Albuterol refill was requested after our encounter ended, I've asked him to return at his convenience to discuss any pulmonary concerns.   Return in about 3 months (around 12/31/2014) for Mood Follow Up.

## 2014-10-01 NOTE — Telephone Encounter (Signed)
JordanLatuda authorization received and pharmacy notified.

## 2014-11-04 ENCOUNTER — Encounter: Payer: Self-pay | Admitting: Family Medicine

## 2014-11-04 ENCOUNTER — Ambulatory Visit (INDEPENDENT_AMBULATORY_CARE_PROVIDER_SITE_OTHER): Payer: BLUE CROSS/BLUE SHIELD | Admitting: Family Medicine

## 2014-11-04 VITALS — BP 147/100 | HR 108 | Temp 98.4°F | Wt 227.0 lb

## 2014-11-04 DIAGNOSIS — F3132 Bipolar disorder, current episode depressed, moderate: Secondary | ICD-10-CM | POA: Diagnosis not present

## 2014-11-04 DIAGNOSIS — J329 Chronic sinusitis, unspecified: Secondary | ICD-10-CM | POA: Diagnosis not present

## 2014-11-04 DIAGNOSIS — A499 Bacterial infection, unspecified: Secondary | ICD-10-CM

## 2014-11-04 DIAGNOSIS — M5431 Sciatica, right side: Secondary | ICD-10-CM

## 2014-11-04 DIAGNOSIS — B9689 Other specified bacterial agents as the cause of diseases classified elsewhere: Secondary | ICD-10-CM

## 2014-11-04 MED ORDER — CYCLOBENZAPRINE HCL 10 MG PO TABS: 10.0000 mg | ORAL_TABLET | Freq: Three times a day (TID) | ORAL | Status: DC | PRN

## 2014-11-04 MED ORDER — AMOXICILLIN-POT CLAVULANATE 500-125 MG PO TABS: ORAL_TABLET | ORAL | Status: AC

## 2014-11-04 MED ORDER — LURASIDONE HCL 40 MG PO TABS: 40.0000 mg | ORAL_TABLET | Freq: Every day | ORAL | Status: DC

## 2014-11-04 MED ORDER — AMOXICILLIN-POT CLAVULANATE 500-125 MG PO TABS: ORAL_TABLET | ORAL | Status: DC

## 2014-11-04 NOTE — Progress Notes (Signed)
CC: Frederick Lloyd is a 39 y.o. male is here for Nasal Congestion   Subjective: HPI:  Complains of right ear fullness and decreased hearing out of that ear with nasal congestion and cough that's been present for the past week. Interventions have included Robitussin without much benefit. Symptoms are worsening slowly on a daily basis. They seem to be present all hours of the day. Nothing particularly makes it better or worse. He denies fevers, chills, wheezing, blood in sputum, productive cough, nor shortness of breath. Review of systems positive for facial pressure in the right cheek  Complains of right buttock pain that is described as a burning and a pulling sensation that radiates down the back of the right leg through the foot. It is worse after standing for long period of time or sitting for long periods of time. It seems to be less bothersome a few minutes after he takes the gabapentin only to return to a moderate degree of pain around 6 hours later. Nothing else has seemed to make it better or worse  He found out that he used to be onLatuda doses 40 mg daily which is been the best medication to help control his bipolar disorder. He denies any manic activity or depression since I saw him last but is worried that 20 mg might not be enough. He denies any known side effects of medication and he can afford it without any difficulty now   Review Of Systems Outlined In HPI  Past Medical History  Diagnosis Date  . Bipolar 1 disorder, depressed, moderate 12/24/2013    Intolerant of depakote    . Pancreatitis   . Asthmatic bronchitis     Past Surgical History  Procedure Laterality Date  . Hand surgery Right     finger surgery andskin graft  . Finger surgery Right    Family History  Problem Relation Age of Onset  . Colon cancer Maternal Uncle   . Colon cancer Paternal Grandmother     great  . Pancreatic cancer Father     ?  . Diabetes Other     both sides of the family  . Heart  disease Paternal Grandfather     History   Social History  . Marital Status: Married    Spouse Name: N/A    Number of Children: 4  . Years of Education: N/A   Occupational History  . Air traffic controller    Social History Main Topics  . Smoking status: Former Smoker    Types: Cigarettes    Quit date: 09/26/2012  . Smokeless tobacco: Never Used     Comment: former tobacco smoker; uses vapor ciggs  . Alcohol Use: Yes     Comment: occasional  . Drug Use: No  . Sexual Activity: Not on file   Other Topics Concern  . Not on file   Social History Narrative     Objective: BP 147/100 mmHg  Pulse 108  Temp(Src) 98.4 F (36.9 C) (Oral)  Wt 227 lb (102.967 kg)  General: Alert and Oriented, No Acute Distress HEENT: Pupils equal, round, reactive to light. Conjunctivae clear.  External ears unremarkable, canals clear with intact TMs with appropriate landmarks.  Left Middle ear appears open without effusion. Right middle ear has a purulent effusion, Pink inferior turbinates.  Moist mucous membranes, pharynx without inflammation nor lesions.  Neck supple without palpable lymphadenopathy nor abnormal masses. Lungs: Clear to auscultation bilaterally, no wheezing/ronchi/rales.  Comfortable work of breathing. Good air movement. Cardiac: Regular  rate and rhythm. Normal S1/S2.  No murmurs, rubs, nor gallops.   Extremities: No peripheral edema.  Strong peripheral pulses.  Mental Status: No depression, anxiety, nor agitation. Skin: Warm and dry.  Assessment & Plan: Frederick Lloyd was seen today for nasal congestion.  Diagnoses and associated orders for this visit:  Bipolar 1 disorder, depressed, moderate - Discontinue: lurasidone (LATUDA) 40 MG TABS tablet; Take 1 tablet (40 mg total) by mouth daily with breakfast. - lurasidone (LATUDA) 40 MG TABS tablet; Take 1 tablet (40 mg total) by mouth daily with breakfast.  Bacterial sinusitis - Discontinue: amoxicillin-clavulanate (AUGMENTIN) 500-125 MG per  tablet; Take one by mouth every 8 hours for ten total days. - amoxicillin-clavulanate (AUGMENTIN) 500-125 MG per tablet; Take one by mouth every 8 hours for ten total days.  Sciatica, right  Other Orders - Discontinue: cyclobenzaprine (FLEXERIL) 10 MG tablet; Take 1 tablet (10 mg total) by mouth 3 (three) times daily as needed for muscle spasms. - cyclobenzaprine (FLEXERIL) 10 MG tablet; Take 1 tablet (10 mg total) by mouth 3 (three) times daily as needed for muscle spasms.    Bipolar disorder: Controlled but since he was knowingly controlled at 40 mg in the past will increase to 40 mg after he finishes this month of 20. bacterial sinusitis and right otitis media: Start Augmentin consider nasal saline washes. Sciatica: Since he was scheduled for same-day appointment for only cough and congestion we did not have time to do a full physical exam but my suspicion is that his discomfort is due to sciatica therefore start program debilitative exercises and stretches that I provided him with along with as needed Flexeril.  25 minutes spent face-to-face during visit today of which at least 50% was counseling or coordinating care regarding: 1. Bipolar 1 disorder, depressed, moderate   2. Bacterial sinusitis   3. Sciatica, right      Return if symptoms worsen or fail to improve.

## 2014-11-26 ENCOUNTER — Other Ambulatory Visit: Payer: Self-pay | Admitting: Family Medicine

## 2014-12-29 ENCOUNTER — Other Ambulatory Visit: Payer: Self-pay | Admitting: *Deleted

## 2014-12-29 MED ORDER — CLONAZEPAM 0.5 MG PO TABS: ORAL_TABLET | ORAL | Status: DC

## 2014-12-31 ENCOUNTER — Ambulatory Visit: Payer: BLUE CROSS/BLUE SHIELD | Admitting: Family Medicine

## 2015-01-01 ENCOUNTER — Ambulatory Visit: Payer: BLUE CROSS/BLUE SHIELD | Admitting: Family Medicine

## 2015-01-22 ENCOUNTER — Other Ambulatory Visit: Payer: Self-pay | Admitting: Family Medicine

## 2015-01-29 ENCOUNTER — Other Ambulatory Visit: Payer: Self-pay | Admitting: Family Medicine

## 2015-01-30 ENCOUNTER — Other Ambulatory Visit: Payer: Self-pay | Admitting: Family Medicine

## 2015-01-30 NOTE — Telephone Encounter (Signed)
Frederick Lloyd, Rx placed in in-box ready for pickup/faxing.  

## 2015-02-04 ENCOUNTER — Other Ambulatory Visit: Payer: Self-pay | Admitting: Family Medicine

## 2015-03-16 ENCOUNTER — Other Ambulatory Visit: Payer: Self-pay | Admitting: Family Medicine

## 2015-04-07 ENCOUNTER — Ambulatory Visit (INDEPENDENT_AMBULATORY_CARE_PROVIDER_SITE_OTHER): Payer: BLUE CROSS/BLUE SHIELD | Admitting: Family Medicine

## 2015-04-07 ENCOUNTER — Other Ambulatory Visit: Payer: Self-pay | Admitting: Family Medicine

## 2015-04-07 ENCOUNTER — Encounter: Payer: Self-pay | Admitting: Family Medicine

## 2015-04-07 VITALS — BP 135/83 | HR 58 | Ht 66.5 in | Wt 223.0 lb

## 2015-04-07 DIAGNOSIS — R252 Cramp and spasm: Secondary | ICD-10-CM

## 2015-04-07 DIAGNOSIS — A499 Bacterial infection, unspecified: Secondary | ICD-10-CM

## 2015-04-07 DIAGNOSIS — J329 Chronic sinusitis, unspecified: Secondary | ICD-10-CM

## 2015-04-07 DIAGNOSIS — F3132 Bipolar disorder, current episode depressed, moderate: Secondary | ICD-10-CM | POA: Diagnosis not present

## 2015-04-07 DIAGNOSIS — B9689 Other specified bacterial agents as the cause of diseases classified elsewhere: Secondary | ICD-10-CM

## 2015-04-07 MED ORDER — LURASIDONE HCL 40 MG PO TABS: 40.0000 mg | ORAL_TABLET | Freq: Every day | ORAL | Status: DC

## 2015-04-07 MED ORDER — CLONAZEPAM 0.5 MG PO TABS: ORAL_TABLET | ORAL | Status: DC

## 2015-04-07 MED ORDER — HYOSCYAMINE SULFATE 0.125 MG PO TABS: ORAL_TABLET | ORAL | Status: DC

## 2015-04-07 MED ORDER — CEFDINIR 300 MG PO CAPS: 300.0000 mg | ORAL_CAPSULE | Freq: Two times a day (BID) | ORAL | Status: AC

## 2015-04-07 NOTE — Progress Notes (Signed)
CC: Frederick Lloyd is a 39 y.o. male is here for Medication Refill   Subjective: HPI:  Follow bipolar disorder: States that since increasing latuda he feels much more stable without any depressive or manic symptoms since I saw him last. He does have occasional anxiety and he tried to go 2-3 weeks without taking any clonazepam to notice that life was much more difficult to deal with with respect to anxiety at work and burning stress home. He restarted taking clonazepam and symptoms of subsided to a degree that do not interfere with quality of life. No thoughts or not harm self or others.  Complains of fullness and decreased hearing in the right ear along with fullness in the right cheek with nasal congestion and postnasal drip. Symptoms aren't present for 2 weeks and slowly worsening. No interventions as of yet. Denies fevers, chills, cough, wheezing or shortness of breath.  He tells me that every summer when he starts sweating more often he gets cramps from the flanks distally. Usually at rest especially when trying to sleep. It usually improves little bit with stretching but will keep him awake most nights of the week. He's tried increasing the potassium in his diet without much benefit. He notices that if he avoid sweating and hot environments cramps will not occur. He denies any weakness nor any other motor or sensory disturbances    Review Of Systems Outlined In HPI  Past Medical History  Diagnosis Date  . Bipolar 1 disorder, depressed, moderate 12/24/2013    Intolerant of depakote    . Pancreatitis   . Asthmatic bronchitis     Past Surgical History  Procedure Laterality Date  . Hand surgery Right     finger surgery andskin graft  . Finger surgery Right    Family History  Problem Relation Age of Onset  . Colon cancer Maternal Uncle   . Colon cancer Paternal Grandmother     great  . Pancreatic cancer Father     ?  . Diabetes Other     both sides of the family  . Heart  disease Paternal Grandfather     History   Social History  . Marital Status: Married    Spouse Name: N/A  . Number of Children: 4  . Years of Education: N/A   Occupational History  . Air traffic controller    Social History Main Topics  . Smoking status: Former Smoker    Types: Cigarettes    Quit date: 09/26/2012  . Smokeless tobacco: Never Used     Comment: former tobacco smoker; uses vapor ciggs  . Alcohol Use: Yes     Comment: occasional  . Drug Use: No  . Sexual Activity: Not on file   Other Topics Concern  . Not on file   Social History Narrative     Objective: BP 135/83 mmHg  Pulse 58  Ht 5' 6.5" (1.689 m)  Wt 223 lb (101.152 kg)  BMI 35.46 kg/m2  General: Alert and Oriented, No Acute Distress HEENT: Pupils equal, round, reactive to light. Conjunctivae clear.  Moist mucous membranes Lungs: Clear to auscultation bilaterally, no wheezing/ronchi/rales.  Comfortable work of breathing. Good air movement. Cardiac: Regular rate and rhythm. Normal S1/S2.  No murmurs, rubs, nor gallops.   Abdomen: Normal bowel sounds, soft and non tender without palpable masses. Extremities: No peripheral edema.  Strong peripheral pulses. Full range of motion and strength in all 4 extremities Mental Status: No depression, anxiety, nor agitation. Skin: Warm and dry.  Assessment & Plan: Frederick Lloyd was seen today for medication refill.  Diagnoses and all orders for this visit:  Bipolar 1 disorder, depressed, moderate Orders: -     clonazePAM (KLONOPIN) 0.5 MG tablet; TAKE ONE-HALF TABLET BY MOUTH ONE TO TWO TIMES A DAY AS NEEDED FOR ANXIETY -     lurasidone (LATUDA) 40 MG TABS tablet; Take 1 tablet (40 mg total) by mouth daily with breakfast.  Cramp of both lower extremities  Bacterial sinusitis Orders: -     cefdinir (OMNICEF) 300 MG capsule; Take 1 capsule (300 mg total) by mouth 2 (two) times daily.  Other orders -     hyoscyamine (LEVSIN, ANASPAZ) 0.125 MG tablet; TAKE ONE TABLET BY  MOUTH EVERY 4 HOURS AS NEEDED FOR ABDOMINAL PAIN   Bipolar disorder: Controlled continue latuda and as needed use of clonazepam Muscle cramping: Start magnesium 200-300 over-the-counter formulation a daily basis, ensure that he is staying well-hydrated Bacterial sinusitis: Start Omnicef consider nasal saline washes On the way out the door he requests a refill of Levsin for IBS.   Return in about 3 months (around 07/08/2015).

## 2015-04-20 ENCOUNTER — Telehealth: Payer: Self-pay | Admitting: *Deleted

## 2015-04-20 MED ORDER — DOXYCYCLINE HYCLATE 100 MG PO TABS: ORAL_TABLET | ORAL | Status: AC

## 2015-04-20 NOTE — Telephone Encounter (Signed)
A new antibiotic, doxycycline, has been sent to the [facility] in [place]

## 2015-04-20 NOTE — Telephone Encounter (Signed)
Pt states he completed the antibiotic course that was prescribed a few weeks ago. He says he still has green mucus and pressure in his ears. He states he doesn't feel sinus infection completley cleared up.please advise

## 2015-04-20 NOTE — Telephone Encounter (Signed)
Pt.notified

## 2015-05-29 ENCOUNTER — Telehealth: Payer: Self-pay | Admitting: *Deleted
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
## 2015-07-09 ENCOUNTER — Other Ambulatory Visit: Payer: Self-pay | Admitting: Family Medicine

## 2015-07-10 ENCOUNTER — Ambulatory Visit: Payer: BLUE CROSS/BLUE SHIELD | Admitting: Family Medicine

## 2015-07-13 ENCOUNTER — Ambulatory Visit (INDEPENDENT_AMBULATORY_CARE_PROVIDER_SITE_OTHER): Payer: BLUE CROSS/BLUE SHIELD | Admitting: Family Medicine

## 2015-07-13 ENCOUNTER — Encounter: Payer: Self-pay | Admitting: Family Medicine

## 2015-07-13 VITALS — BP 139/93 | HR 89 | Wt 218.0 lb

## 2015-07-13 DIAGNOSIS — J4530 Mild persistent asthma, uncomplicated: Secondary | ICD-10-CM

## 2015-07-13 DIAGNOSIS — F3132 Bipolar disorder, current episode depressed, moderate: Secondary | ICD-10-CM

## 2015-07-13 MED ORDER — PREDNISONE 20 MG PO TABS: ORAL_TABLET | ORAL | Status: AC

## 2015-07-13 NOTE — Progress Notes (Signed)
CC: Frederick Lloyd is a 39 y.o. male is here for f/u Bipolar Disorder   Subjective: HPI:  Follow-up bipolar disorder: Continues to take Latuda 40 mg daily. Denies any manic activity or anxiety. He tells me a few days out of the week he will feel like he just wants to crawl into a hole and sleep the day away. He denies any thoughts or not himself or others. Since I saw him last he admits he's had some decreased interest in pleasurable activities. No intentional weight loss or gain. Denies any hallucinations or paranoia.  Complains of productive cough that has been present for the past 1 or 2 weeks. Symptoms are worse first thing in the morning but moderate in severity all hours of the day. He tells me he had asthma as a child and these symptoms remind him of prior asthma exacerbations but he admits no wheezing or shortness of breath lately. Albuterol seems to help nothing else seems to make better or worse. He denies fevers, chills, no chest pain.   Review Of Systems Outlined In HPI  Past Medical History  Diagnosis Date  . Bipolar 1 disorder, depressed, moderate (HCC) 12/24/2013    Intolerant of depakote    . Pancreatitis   . Asthmatic bronchitis     Past Surgical History  Procedure Laterality Date  . Hand surgery Right     finger surgery andskin graft  . Finger surgery Right    Family History  Problem Relation Age of Onset  . Colon cancer Maternal Uncle   . Colon cancer Paternal Grandmother     great  . Pancreatic cancer Father     ?  . Diabetes Other     both sides of the family  . Heart disease Paternal Grandfather     Social History   Social History  . Marital Status: Married    Spouse Name: N/A  . Number of Children: 4  . Years of Education: N/A   Occupational History  . Air traffic controllerautomotive tech    Social History Main Topics  . Smoking status: Former Smoker    Types: Cigarettes    Quit date: 09/26/2012  . Smokeless tobacco: Never Used     Comment: former tobacco  smoker; uses vapor ciggs  . Alcohol Use: Yes     Comment: occasional  . Drug Use: No  . Sexual Activity: Not on file   Other Topics Concern  . Not on file   Social History Narrative     Objective: BP 139/93 mmHg  Pulse 89  Wt 218 lb (98.884 kg)  General: Alert and Oriented, No Acute Distress HEENT: Pupils equal, round, reactive to light. Conjunctivae clear.  External ears unremarkable, canals clear with intact TMs with appropriate landmarks.  Middle ear appears open without effusion. Pink inferior turbinates.  Moist mucous membranes, pharynx without inflammation nor lesions.  Neck supple without palpable lymphadenopathy nor abnormal masses. Lungs: Clear to auscultation bilaterally, no wheezing/ronchi/rales.  Comfortable work of breathing. Good air movement. Cardiac: Regular rate and rhythm. Normal S1/S2.  No murmurs, rubs, nor gallops.   Extremities: No peripheral edema.  Strong peripheral pulses.  Mental Status: No depression, anxiety, nor agitation. Skin: Warm and dry.  Assessment & Plan: Frederick Lloyd was seen today for f/u bipolar disorder.  Diagnoses and all orders for this visit:  Bipolar 1 disorder, depressed, moderate (HCC)  Reactive airway disease, mild persistent, uncomplicated -     predniSONE (DELTASONE) 20 MG tablet; Three tabs daily days 1-3, two  tabs daily days 4-6, one tab daily days 7-9, half tab daily days 10-13.   Bipolar disorder: uncontrolled, I encouraged him to consider going up to 80 mg of Latuda.  He would like to wait and see how he feels after he gets over his respiratory illness. I've advised him that he can call whenever he wants if he feels ready to increase to 80 mg. Reactive airway disease: Start prednisone taper. If no better by Wednesday evening and call for antibiotic   Return in about 3 months (around 10/13/2015).

## 2015-08-12 ENCOUNTER — Other Ambulatory Visit: Payer: Self-pay | Admitting: Sports Medicine

## 2015-08-13 ENCOUNTER — Telehealth: Payer: Self-pay | Admitting: Family Medicine

## 2015-08-13 NOTE — Telephone Encounter (Signed)
Opened for care everywhere 

## 2015-08-24 ENCOUNTER — Telehealth: Payer: Self-pay

## 2015-08-24 MED ORDER — AZITHROMYCIN 250 MG PO TABS: ORAL_TABLET | ORAL | Status: AC

## 2015-08-24 NOTE — Telephone Encounter (Signed)
Azithromycin sent to [facility].

## 2015-08-24 NOTE — Telephone Encounter (Signed)
Pt was Rx'd prednisone in Oct. and he reports that his Sx aren't getting any better.  Can a antibiotic be sent in or should he be seen?

## 2015-08-24 NOTE — Telephone Encounter (Signed)
Pt.notified

## 2015-09-15 ENCOUNTER — Other Ambulatory Visit: Payer: Self-pay | Admitting: Family Medicine

## 2015-09-16 ENCOUNTER — Telehealth: Payer: Self-pay | Admitting: Family Medicine

## 2015-09-16 NOTE — Telephone Encounter (Signed)
Care everywhere 

## 2015-09-21 ENCOUNTER — Other Ambulatory Visit: Payer: Self-pay | Admitting: Family Medicine

## 2015-09-22 ENCOUNTER — Other Ambulatory Visit: Payer: Self-pay

## 2015-09-22 MED ORDER — CLONAZEPAM 0.5 MG PO TABS: ORAL_TABLET | ORAL | Status: DC

## 2015-09-24 ENCOUNTER — Other Ambulatory Visit: Payer: Self-pay | Admitting: Family Medicine

## 2015-09-25 ENCOUNTER — Ambulatory Visit (INDEPENDENT_AMBULATORY_CARE_PROVIDER_SITE_OTHER): Payer: BLUE CROSS/BLUE SHIELD | Admitting: Physician Assistant

## 2015-09-25 ENCOUNTER — Encounter: Payer: Self-pay | Admitting: Physician Assistant

## 2015-09-25 VITALS — BP 121/79 | HR 67 | Temp 98.1°F | Ht 66.5 in | Wt 222.0 lb

## 2015-09-25 DIAGNOSIS — J029 Acute pharyngitis, unspecified: Secondary | ICD-10-CM

## 2015-09-25 DIAGNOSIS — R05 Cough: Secondary | ICD-10-CM | POA: Diagnosis not present

## 2015-09-25 DIAGNOSIS — J069 Acute upper respiratory infection, unspecified: Secondary | ICD-10-CM

## 2015-09-25 DIAGNOSIS — R059 Cough, unspecified: Secondary | ICD-10-CM

## 2015-09-25 LAB — POCT RAPID STREP A (OFFICE): RAPID STREP A SCREEN: NEGATIVE

## 2015-09-25 MED ORDER — AMOXICILLIN-POT CLAVULANATE 875-125 MG PO TABS: 1.0000 | ORAL_TABLET | Freq: Two times a day (BID) | ORAL | Status: DC

## 2015-09-25 MED ORDER — HYDROCOD POLST-CPM POLST ER 10-8 MG/5ML PO SUER: 5.0000 mL | Freq: Two times a day (BID) | ORAL | Status: DC | PRN

## 2015-09-25 MED ORDER — PREDNISONE 20 MG PO TABS: ORAL_TABLET | ORAL | Status: DC

## 2015-09-25 NOTE — Progress Notes (Signed)
   Subjective:    Patient ID: Frederick Lloyd, male    DOB: 08-Nov-1975, 39 y.o.   MRN: [id number]  HPI  Pt is a 39 yo male who presents to the clinic with over a week of cough, ST, ear pain, sinus pressure, fatigue. Cough is so bad causes dry heaves at times. Exposure to flu and strep. Felt hot but not checked temperature. More right ear pain than left. Hx of strep throat every year. Taking robatussin, benadryl and ibuprofen with no improvement.     Review of Systems  All other systems reviewed and are negative.      Objective:   Physical Exam  Constitutional: He is oriented to person, place, and time. He appears well-developed and well-nourished.  HENT:  Head: Normocephalic and atraumatic.  Right Ear: External ear normal.  Left Ear: External ear normal.  TM's clear bilaterally.  Oropharynx erythematous without tonsillar enlargement or exudate.  Bilateral red and swollen nasal turbinates.  Some mild tenderness over bilateral maxillary sinuses to palpation.   Eyes: Conjunctivae are normal. Right eye exhibits no discharge. Left eye exhibits no discharge.  Neck: Normal range of motion. Neck supple.  Cardiovascular: Normal rate, regular rhythm and normal heart sounds.   Pulmonary/Chest: Effort normal and breath sounds normal. He has no wheezes.  Lymphadenopathy:    He has no cervical adenopathy.  Neurological: He is alert and oriented to person, place, and time.  Psychiatric: He has a normal mood and affect. His behavior is normal.          Assessment & Plan:  Acute upper respiratory infection/Sore throat- lasted too long to check for flu. rapid strep negative. Augmentin for 10 days. tussionex for cough at bedtime. Prednisone taper given. Follow up if not improving. Symptomatic care discussed as well.

## 2015-09-25 NOTE — Patient Instructions (Signed)
Upper Respiratory Infection, Adult Most upper respiratory infections (URIs) are a viral infection of the air passages leading to the lungs. A URI affects the nose, throat, and upper air passages. The most common type of URI is nasopharyngitis and is typically referred to as "the common cold." URIs run their course and usually go away on their own. Most of the time, a URI does not require medical attention, but sometimes a bacterial infection in the upper airways can follow a viral infection. This is called a secondary infection. Sinus and middle ear infections are common types of secondary upper respiratory infections. Bacterial pneumonia can also complicate a URI. A URI can worsen asthma and chronic obstructive pulmonary disease (COPD). Sometimes, these complications can require emergency medical care and may be life threatening.  CAUSES Almost all URIs are caused by viruses. A virus is a type of germ and can spread from one person to another.  RISKS FACTORS You may be at risk for a URI if:   You smoke.   You have chronic heart or lung disease.  You have a weakened defense (immune) system.   You are very young or very old.   You have nasal allergies or asthma.  You work in crowded or poorly ventilated areas.  You work in health care facilities or schools. SIGNS AND SYMPTOMS  Symptoms typically develop 2-3 days after you come in contact with a cold virus. Most viral URIs last 7-10 days. However, viral URIs from the influenza virus (flu virus) can last 14-18 days and are typically more severe. Symptoms may include:   Runny or stuffy (congested) nose.   Sneezing.   Cough.   Sore throat.   Headache.   Fatigue.   Fever.   Loss of appetite.   Pain in your forehead, behind your eyes, and over your cheekbones (sinus pain).  Muscle aches.  DIAGNOSIS  Your health care provider may diagnose a URI by:  Physical exam.  Tests to check that your symptoms are not due to  another condition such as:  Strep throat.  Sinusitis.  Pneumonia.  Asthma. TREATMENT  A URI goes away on its own with time. It cannot be cured with medicines, but medicines may be prescribed or recommended to relieve symptoms. Medicines may help:  Reduce your fever.  Reduce your cough.  Relieve nasal congestion. HOME CARE INSTRUCTIONS   Take medicines only as directed by your health care provider.   Gargle warm saltwater or take cough drops to comfort your throat as directed by your health care provider.  Use a warm mist humidifier or inhale steam from a shower to increase air moisture. This may make it easier to breathe.  Drink enough fluid to keep your urine clear or pale yellow.   Eat soups and other clear broths and maintain good nutrition.   Rest as needed.   Return to work when your temperature has returned to normal or as your health care provider advises. You may need to stay home longer to avoid infecting others. You can also use a face mask and careful hand washing to prevent spread of the virus.  Increase the usage of your inhaler if you have asthma.   Do not use any tobacco products, including cigarettes, chewing tobacco, or electronic cigarettes. If you need help quitting, ask your health care provider. PREVENTION  The best way to protect yourself from getting a cold is to practice good hygiene.   Avoid oral or hand contact with people with cold   symptoms.   Wash your hands often if contact occurs.  There is no clear evidence that vitamin C, vitamin E, echinacea, or exercise reduces the chance of developing a cold. However, it is always recommended to get plenty of rest, exercise, and practice good nutrition.  SEEK MEDICAL CARE IF:   You are getting worse rather than better.   Your symptoms are not controlled by medicine.   You have chills.  You have worsening shortness of breath.  You have brown or red mucus.  You have yellow or brown nasal  discharge.  You have pain in your face, especially when you bend forward.  You have a fever.  You have swollen neck glands.  You have pain while swallowing.  You have white areas in the back of your throat. SEEK IMMEDIATE MEDICAL CARE IF:   You have severe or persistent:  Headache.  Ear pain.  Sinus pain.  Chest pain.  You have chronic lung disease and any of the following:  Wheezing.  Prolonged cough.  Coughing up blood.  A change in your usual mucus.  You have a stiff neck.  You have changes in your:  Vision.  Hearing.  Thinking.  Mood. MAKE SURE YOU:   Understand these instructions.  Will watch your condition.  Will get help right away if you are not doing well or get worse.   This information is not intended to replace advice given to you by your health care provider. Make sure you discuss any questions you have with your health care provider.   Document Released: 03/08/2001 Document Revised: 01/27/2015 Document Reviewed: 12/18/2013 [employer]vier Interactive Patient Education 2016 [employer]  

## 2015-10-12 ENCOUNTER — Ambulatory Visit (INDEPENDENT_AMBULATORY_CARE_PROVIDER_SITE_OTHER): Payer: BLUE CROSS/BLUE SHIELD | Admitting: Family Medicine

## 2015-10-12 ENCOUNTER — Encounter: Payer: Self-pay | Admitting: Family Medicine

## 2015-10-12 VITALS — BP 134/93 | HR 80 | Wt 220.0 lb

## 2015-10-12 DIAGNOSIS — F3132 Bipolar disorder, current episode depressed, moderate: Secondary | ICD-10-CM

## 2015-10-12 DIAGNOSIS — M545 Low back pain: Secondary | ICD-10-CM | POA: Diagnosis not present

## 2015-10-12 DIAGNOSIS — K648 Other hemorrhoids: Secondary | ICD-10-CM

## 2015-10-12 LAB — CBC
HCT: 48.3 % (ref 39.0–52.0)
Hemoglobin: 16.4 g/dL (ref 13.0–17.0)
MCH: 30.6 pg (ref 26.0–34.0)
MCHC: 34 g/dL (ref 30.0–36.0)
MCV: 90.1 fL (ref 78.0–100.0)
MPV: 11 fL (ref 8.6–12.4)
PLATELETS: 245 10*3/uL (ref 150–400)
RBC: 5.36 MIL/uL (ref 4.22–5.81)
RDW: 14.4 % (ref 11.5–15.5)
WBC: 9.7 10*3/uL (ref 4.0–10.5)

## 2015-10-12 MED ORDER — CYCLOBENZAPRINE HCL 10 MG PO TABS: 10.0000 mg | ORAL_TABLET | Freq: Three times a day (TID) | ORAL | Status: DC | PRN

## 2015-10-12 MED ORDER — HYOSCYAMINE SULFATE 0.125 MG PO TABS: ORAL_TABLET | ORAL | Status: DC

## 2015-10-12 MED ORDER — CLONAZEPAM 0.5 MG PO TABS: ORAL_TABLET | ORAL | Status: DC

## 2015-10-12 MED ORDER — GABAPENTIN 300 MG PO CAPS: 300.0000 mg | ORAL_CAPSULE | Freq: Three times a day (TID) | ORAL | Status: DC

## 2015-10-12 MED ORDER — LURASIDONE HCL 40 MG PO TABS: 40.0000 mg | ORAL_TABLET | Freq: Every day | ORAL | Status: AC

## 2015-10-12 NOTE — Progress Notes (Signed)
CC: Frederick Lloyd is a 40 y.o. male is here for Depression and Blood In Stools   Subjective: HPI:  Intermittent bright red blood in stools. Occurs every few weeks for a few days. No interventions other than preparation ever since a colonoscopy in 2015 showed the only abnormality of internal hemorrhoids. Reports symptoms as mild in severity. Nothing seems to make it better or worse. Denies shortness of breath or chest discomfort. He is worried that he might be losing dangerous amounts of blood as this has been going on for months now.  Follow-up bipolar: Denies any depression or manic activity. Denies any mental disturbance. He is pleased with the response of both Latuda and clonazepam.  He is requesting a refill on Flexeril and gabapentin. He takes gabapentin 3 times a day and it has made a drastic improvement with low back pain. If he lifts something heavy during the day as his job as a [employer] he feels a spasm sensation in the lower back. Flexeril has always help with this in the past. He denies any radicular symptoms or saddle paresthesia.   Review Of Systems Outlined In HPI  Past Medical History  Diagnosis Date  . Bipolar 1 disorder, depressed, moderate (HCC) 12/24/2013    Intolerant of depakote    . Pancreatitis   . Asthmatic bronchitis     Past Surgical History  Procedure Laterality Date  . Hand surgery Right     finger surgery andskin graft  . Finger surgery Right    Family History  Problem Relation Age of Onset  . Colon cancer Maternal Uncle   . Colon cancer Paternal Grandmother     great  . Pancreatic cancer Father     ?  . Diabetes Other     both sides of the family  . Heart disease Paternal Grandfather     Social History   Social History  . Marital Status: Married    Spouse Name: N/A  . Number of Children: 4  . Years of Education: N/A   Occupational History  . Air traffic controllerautomotive tech    Social History Main Topics  . Smoking status: Former Smoker    Types:  Cigarettes    Quit date: 09/26/2012  . Smokeless tobacco: Never Used     Comment: former tobacco smoker; uses vapor ciggs  . Alcohol Use: Yes     Comment: occasional  . Drug Use: No  . Sexual Activity: Not on file   Other Topics Concern  . Not on file   Social History Narrative     Objective: BP 134/93 mmHg  Pulse 80  Wt 220 lb (99.791 kg)  Vital signs reviewed. General: Alert and Oriented, No Acute Distress HEENT: Pupils equal, round, reactive to light. Conjunctivae clear.  External ears unremarkable.  Moist mucous membranes. Lungs: Clear and comfortable work of breathing, speaking in full sentences without accessory muscle use. Cardiac: Regular rate and rhythm.  Neuro: CN Lloyd-XII grossly intact, gait normal. Extremities: No peripheral edema.  Strong peripheral pulses.  Mental Status: No depression, anxiety, nor agitation. Logical though process. Skin: Warm and dry.  Assessment & Plan: Frederick KaufmanMarvin was seen today for depression and blood in stools.  Diagnoses and all orders for this visit:  Internal hemorrhoids -     CBC  Bipolar 1 disorder, depressed, moderate (HCC) -     lurasidone (LATUDA) 40 MG TABS tablet; Take 1 tablet (40 mg total) by mouth daily with breakfast.  Low back pain, unspecified back pain laterality,  with sciatica presence unspecified  Other orders -     clonazePAM (KLONOPIN) 0.5 MG tablet; Take one-half tablet by mouth once or twice daily as need for anxiety. -     gabapentin (NEURONTIN) 300 MG capsule; Take 1 capsule (300 mg total) by mouth 3 (three) times daily. -     hyoscyamine (LEVSIN, ANASPAZ) 0.125 MG tablet; TAKE ONE TABLET BY MOUTH EVERY 4 HOURS AS NEEDED FOR ABDOMINAL PAIN -     cyclobenzaprine (FLEXERIL) 10 MG tablet; Take 1 tablet (10 mg total) by mouth 3 (three) times daily as needed for muscle spasms.   Internal hemorrhoids: Start psyllium powder 2 tablespoons mixed in water nightly. Reassurance provided that its unlikely that he is losing  dangerous amounts of blood however to help calm his nerves will check a CBC today. Bipolar disorder: Controlled continue Latuda and clonazepam chronic low back pain: Controlled withFlexeril and gabapentin.   Return in about 3 months (around 01/10/2016).

## 2015-11-23 ENCOUNTER — Other Ambulatory Visit: Payer: Self-pay | Admitting: Family Medicine

## 2015-12-15 ENCOUNTER — Encounter: Payer: Self-pay | Admitting: Family Medicine

## 2015-12-15 ENCOUNTER — Ambulatory Visit (INDEPENDENT_AMBULATORY_CARE_PROVIDER_SITE_OTHER): Payer: BLUE CROSS/BLUE SHIELD | Admitting: Family Medicine

## 2015-12-15 VITALS — BP 148/109 | HR 67 | Wt 217.0 lb

## 2015-12-15 DIAGNOSIS — R109 Unspecified abdominal pain: Secondary | ICD-10-CM | POA: Diagnosis not present

## 2015-12-15 DIAGNOSIS — A499 Bacterial infection, unspecified: Secondary | ICD-10-CM

## 2015-12-15 DIAGNOSIS — F3132 Bipolar disorder, current episode depressed, moderate: Secondary | ICD-10-CM

## 2015-12-15 DIAGNOSIS — B9689 Other specified bacterial agents as the cause of diseases classified elsewhere: Secondary | ICD-10-CM

## 2015-12-15 DIAGNOSIS — J329 Chronic sinusitis, unspecified: Secondary | ICD-10-CM | POA: Diagnosis not present

## 2015-12-15 MED ORDER — CLONAZEPAM 0.5 MG PO TABS: ORAL_TABLET | ORAL | Status: DC

## 2015-12-15 MED ORDER — HYOSCYAMINE SULFATE 0.125 MG PO TABS: ORAL_TABLET | ORAL | Status: AC

## 2015-12-15 MED ORDER — CYCLOBENZAPRINE HCL 10 MG PO TABS: 10.0000 mg | ORAL_TABLET | Freq: Three times a day (TID) | ORAL | Status: DC | PRN

## 2015-12-15 MED ORDER — DOXYCYCLINE HYCLATE 100 MG PO TABS: ORAL_TABLET | ORAL | Status: AC

## 2015-12-15 MED ORDER — HYDROCOD POLST-CPM POLST ER 10-8 MG/5ML PO SUER: 5.0000 mL | Freq: Two times a day (BID) | ORAL | Status: DC | PRN

## 2015-12-15 NOTE — Progress Notes (Signed)
CC: Frederick Lloyd is a 40 y.o. male is here for Tinnitus; Dizziness; Headache; and Nausea   Subjective: HPI:  Complains of  pressure in the cheeks and nasal congestion for the past week. He woke up yesterday with a fullness sensation in the right ear along with ringing in the ear but no pain in the ear. He denies any hearing loss. No interventions as of yet. Comfortable nonproductive cough present only for the first few hours in the morning. Denies sore throat, wheezing, chest pain or shortness of breath.  Insurance doesn't seem to be covering Levsin, is asking for the generic equivalent today.  Follow bipolar disorder: He is due for refill on clonazepam and would like to know if he can have his refill today.    Review Of Systems Outlined In HPI  Past Medical History  Diagnosis Date  . Bipolar 1 disorder, depressed, moderate (HCC) 12/24/2013    Intolerant of depakote    . Pancreatitis   . Asthmatic bronchitis     Past Surgical History  Procedure Laterality Date  . Hand surgery Right     finger surgery andskin graft  . Finger surgery Right    Family History  Problem Relation Age of Onset  . Colon cancer Maternal Uncle   . Colon cancer Paternal Grandmother     great  . Pancreatic cancer Father     ?  . Diabetes Other     both sides of the family  . Heart disease Paternal Grandfather     Social History   Social History  . Marital Status: Married    Spouse Name: N/A  . Number of Children: 4  . Years of Education: N/A   Occupational History  . Air traffic controllerautomotive tech    Social History Main Topics  . Smoking status: Former Smoker    Types: Cigarettes    Quit date: 09/26/2012  . Smokeless tobacco: Never Used     Comment: former tobacco smoker; uses vapor ciggs  . Alcohol Use: Yes     Comment: occasional  . Drug Use: No  . Sexual Activity: Not on file   Other Topics Concern  . Not on file   Social History Narrative     Objective: BP 148/109 mmHg  Pulse 67  Wt  217 lb (98.431 kg)  General: Alert and Oriented, No Acute Distress HEENT: Pupils equal, round, reactive to light. Conjunctivae clear.  External ears unremarkable, canals clear with intact TMs with appropriate landmarks.  Middle ear appears open without effusion. Pink inferior turbinates.  Moist mucous membranes, pharynx without inflammation nor lesions.  Neck supple without palpable lymphadenopathy nor abnormal masses. Lungs: Clear to auscultation bilaterally, no wheezing/ronchi/rales.  Comfortable work of breathing. Good air movement. Extremities: No peripheral edema.  Strong peripheral pulses.  Mental Status: No depression, anxiety, nor agitation. Skin: Warm and dry.  Assessment & Plan: [name] was seen today for tinnitus, dizziness, headache and nausea.  Diagnoses and all orders for this visit:  Bacterial sinusitis -     doxycycline (VIBRA-TABS) 100 MG tablet; One by mouth twice a day for ten days.  Abdominal pain, unspecified abdominal location  Bipolar 1 disorder, depressed, moderate (HCC)  Other orders -     clonazePAM (KLONOPIN) 0.5 MG tablet; Take one-half tablet by mouth once or twice daily as need for anxiety. -     cyclobenzaprine (FLEXERIL) 10 MG tablet; Take 1 tablet (10 mg total) by mouth 3 (three) times daily as needed for muscle spasms. -  hyoscyamine (LEVSIN, ANASPAZ) 0.125 MG tablet; TAKE ONE TABLET BY MOUTH EVERY 4 HOURS AS NEEDED FOR ABDOMINAL PAIN -     chlorpheniramine-HYDROcodone (TUSSIONEX PENNKINETIC ER) 10-8 MG/5ML SUER; Take 5 mLs by mouth every 12 (twelve) hours as needed for cough.   Bacterial sinusitis: Start doxycycline and consider nasal saline washes. He is requesting a refill on Tussionex that been beneficial for his cough in the past, seems reasonable Abdominal pain: Controlled with Levsin, per his request a generic equivalent has been sent in Bipolar disorder: Controlled with occasional use of clonazepam and daily Latuda    Return if symptoms  worsen or fail to improve.

## 2015-12-24 ENCOUNTER — Other Ambulatory Visit: Payer: Self-pay | Admitting: Family Medicine

## 2016-01-05 ENCOUNTER — Telehealth: Payer: Self-pay

## 2016-01-05 MED ORDER — NEOMYCIN-POLYMYXIN-HC 1 % OT SOLN: OTIC | Status: AC

## 2016-01-05 NOTE — Telephone Encounter (Signed)
Rx of cortisporin ear drops have been [place] [facility] [place], if not better by Thursday I'd recommend he schedule a visit

## 2016-01-05 NOTE — Telephone Encounter (Signed)
Pt reports that he is having pain in his right ear.  Should he be seen or can something be sent to pharmacy? Please advise.

## 2016-01-22 ENCOUNTER — Other Ambulatory Visit: Payer: Self-pay | Admitting: Family Medicine

## 2016-02-04 ENCOUNTER — Encounter: Payer: Self-pay | Admitting: Sports Medicine

## 2016-02-04 ENCOUNTER — Ambulatory Visit (INDEPENDENT_AMBULATORY_CARE_PROVIDER_SITE_OTHER): Payer: BLUE CROSS/BLUE SHIELD | Admitting: Sports Medicine

## 2016-02-04 ENCOUNTER — Ambulatory Visit (INDEPENDENT_AMBULATORY_CARE_PROVIDER_SITE_OTHER): Payer: BLUE CROSS/BLUE SHIELD

## 2016-02-04 VITALS — BP 137/95 | HR 97 | Resp 18 | Wt 213.9 lb

## 2016-02-04 DIAGNOSIS — M542 Cervicalgia: Secondary | ICD-10-CM

## 2016-02-04 DIAGNOSIS — M25511 Pain in right shoulder: Secondary | ICD-10-CM | POA: Diagnosis not present

## 2016-02-04 DIAGNOSIS — S134XXA Sprain of ligaments of cervical spine, initial encounter: Secondary | ICD-10-CM

## 2016-02-04 MED ORDER — TRAMADOL HCL 50 MG PO TABS: ORAL_TABLET | ORAL | Status: AC

## 2016-02-04 MED ORDER — CYCLOBENZAPRINE HCL 10 MG PO TABS: ORAL_TABLET | ORAL | Status: DC

## 2016-02-04 MED ORDER — PREDNISONE 50 MG PO TABS: ORAL_TABLET | ORAL | Status: AC
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
## 2016-02-22 ENCOUNTER — Other Ambulatory Visit: Payer: Self-pay | Admitting: Family Medicine

## 2016-02-29 ENCOUNTER — Other Ambulatory Visit: Payer: Self-pay | Admitting: Family Medicine

## 2016-03-03 ENCOUNTER — Ambulatory Visit: Payer: BLUE CROSS/BLUE SHIELD | Admitting: Sports Medicine

## 2016-03-14 ENCOUNTER — Other Ambulatory Visit: Payer: Self-pay | Admitting: Family Medicine

## 2016-04-01 ENCOUNTER — Other Ambulatory Visit: Payer: Self-pay | Admitting: Family Medicine

## 2016-04-01 ENCOUNTER — Other Ambulatory Visit: Payer: Self-pay

## 2016-04-06 ENCOUNTER — Telehealth: Payer: Self-pay

## 2016-04-06 MED ORDER — LAMOTRIGINE 25 MG PO TABS: ORAL_TABLET | ORAL | Status: AC

## 2016-04-06 NOTE — Telephone Encounter (Signed)
lamictal titration sent to [facility] in [place]

## 2016-04-06 NOTE — Telephone Encounter (Signed)
Pt.notified

## 2016-04-06 NOTE — Telephone Encounter (Signed)
Pt reports that for him to get his latuda filled is will be $1,000 and he can't afford that.  He is in the middle of switching jobs and is asking for something for something cheaper until his insurance kicks in with his new job. Please advise.

## 2016-05-02 ENCOUNTER — Other Ambulatory Visit: Payer: Self-pay | Admitting: Sports Medicine

## 2016-05-02 NOTE — Telephone Encounter (Signed)
[place], Rx placed in in-box ready for pickup/faxing.  

## 2016-06-13 ENCOUNTER — Other Ambulatory Visit: Payer: Self-pay | Admitting: Sports Medicine

## 2016-07-19 ENCOUNTER — Other Ambulatory Visit: Payer: Self-pay | Admitting: Sports Medicine

## 2016-07-20 ENCOUNTER — Other Ambulatory Visit: Payer: Self-pay | Admitting: *Deleted

## 2016-07-20 MED ORDER — CLONAZEPAM 0.5 MG PO TABS: ORAL_TABLET | ORAL | 0 refills | Status: AC
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
